# Patient Record
Sex: Male | Born: 1970 | Race: White | Hispanic: No | Marital: Married | Smoking: Never smoker
Health system: Southern US, Community
[De-identification: ages and names within clinical notes are randomized; demographics above are authoritative.]

## PROBLEM LIST (undated history)

## (undated) DIAGNOSIS — F329 Major depressive disorder, single episode, unspecified: Secondary | ICD-10-CM

## (undated) DIAGNOSIS — B019 Varicella without complication: Secondary | ICD-10-CM

## (undated) DIAGNOSIS — R319 Hematuria, unspecified: Secondary | ICD-10-CM

## (undated) DIAGNOSIS — N200 Calculus of kidney: Secondary | ICD-10-CM

## (undated) DIAGNOSIS — I10 Essential (primary) hypertension: Secondary | ICD-10-CM

## (undated) DIAGNOSIS — L719 Rosacea, unspecified: Secondary | ICD-10-CM

## (undated) DIAGNOSIS — F32A Depression, unspecified: Secondary | ICD-10-CM

## (undated) DIAGNOSIS — G4730 Sleep apnea, unspecified: Secondary | ICD-10-CM

## (undated) DIAGNOSIS — E785 Hyperlipidemia, unspecified: Secondary | ICD-10-CM

## (undated) DIAGNOSIS — T7840XA Allergy, unspecified, initial encounter: Secondary | ICD-10-CM

## (undated) DIAGNOSIS — T884XXA Failed or difficult intubation, initial encounter: Secondary | ICD-10-CM

## (undated) DIAGNOSIS — N189 Chronic kidney disease, unspecified: Secondary | ICD-10-CM

## (undated) DIAGNOSIS — Z87442 Personal history of urinary calculi: Secondary | ICD-10-CM

## (undated) HISTORY — DX: Varicella without complication: B01.9

## (undated) HISTORY — DX: Sleep apnea, unspecified: G47.30

## (undated) HISTORY — DX: Rosacea, unspecified: L71.9

## (undated) HISTORY — DX: Depression, unspecified: F32.A

## (undated) HISTORY — PX: NASAL SINUS SURGERY: SHX719

## (undated) HISTORY — DX: Allergy, unspecified, initial encounter: T78.40XA

## (undated) HISTORY — DX: Chronic kidney disease, unspecified: N18.9

## (undated) HISTORY — PX: LITHOTRIPSY: SUR834

## (undated) HISTORY — PX: BACK SURGERY: SHX140

## (undated) HISTORY — DX: Hematuria, unspecified: R31.9

## (undated) HISTORY — DX: Major depressive disorder, single episode, unspecified: F32.9

## (undated) HISTORY — DX: Hyperlipidemia, unspecified: E78.5

## (undated) HISTORY — PX: COLONOSCOPY: SHX174

## (undated) HISTORY — DX: Calculus of kidney: N20.0

## (undated) HISTORY — PX: WISDOM TOOTH EXTRACTION: SHX21

---

## 2006-03-04 ENCOUNTER — Ambulatory Visit: Payer: Self-pay | Admitting: Unknown Physician Specialty

## 2006-03-14 ENCOUNTER — Ambulatory Visit: Payer: Self-pay | Admitting: Internal Medicine

## 2006-03-19 ENCOUNTER — Ambulatory Visit: Payer: Self-pay | Admitting: Urology

## 2006-03-20 ENCOUNTER — Emergency Department: Payer: Self-pay | Admitting: Unknown Physician Specialty

## 2006-03-20 ENCOUNTER — Ambulatory Visit: Payer: Self-pay | Admitting: Urology

## 2006-04-01 ENCOUNTER — Ambulatory Visit: Payer: Self-pay | Admitting: Urology

## 2006-04-14 ENCOUNTER — Ambulatory Visit: Payer: Self-pay | Admitting: Urology

## 2007-12-18 ENCOUNTER — Ambulatory Visit: Payer: Self-pay | Admitting: Orthopedic Surgery

## 2011-07-16 ENCOUNTER — Ambulatory Visit: Payer: Self-pay | Admitting: Unknown Physician Specialty

## 2013-12-01 DIAGNOSIS — F329 Major depressive disorder, single episode, unspecified: Secondary | ICD-10-CM | POA: Insufficient documentation

## 2013-12-01 DIAGNOSIS — F32A Depression, unspecified: Secondary | ICD-10-CM | POA: Insufficient documentation

## 2013-12-01 DIAGNOSIS — G4730 Sleep apnea, unspecified: Secondary | ICD-10-CM | POA: Insufficient documentation

## 2014-06-01 DIAGNOSIS — E785 Hyperlipidemia, unspecified: Secondary | ICD-10-CM | POA: Insufficient documentation

## 2014-07-17 NOTE — Op Note (Signed)
PATIENT NAME:  Gregory Guzman, Gregory Guzman MR#:  [id number] DATE OF BIRTH:  March 02, 1971  DATE OF PROCEDURE:  07/16/2011  PREOPERATIVE DIAGNOSIS: Nasal obstruction.   POSTOPERATIVE DIAGNOSIS: Nasal obstruction.  OPERATION PERFORMED:  1. Nasal septoplasty.  2. Bilateral submucous resection inferior turbinates.   SURGEON: Davina Pokehapman T. Makinzi Prieur, MD   OPERATIVE FINDINGS: A large inferior left septal spur and bilateral inferior turbinate hypertrophy.   DESCRIPTION OF PROCEDURE: Gregory Guzman was identified in the holding area and taken to the operating room and placed in the supine position. After general endotracheal anesthesia, the table was turned 90 degrees. The patient was placed in semi-Fowler position. A topical anesthetic of phenylephrine lidocaine was placed on pledgets within each nostril. After approximately five minutes these were removed. Intranasal exam showed an inferior left septal spur and bilateral inferior turbinate hypertrophy. A local anesthetic of 1% lidocaine 1:100,000 epinephrine was used to inject the nasal septum as well as the inferior turbinates. A total of 11 mL was used. With the patient draped in the usual fashion for nasal surgery, a hemitransfixion incision was created in the leading edge of the septum on the right hand side. Subperichondrial plane was elevated posteriorly on the left. The inferior left septal spur was identified. Using the small chisel this was chiseled free and removed. This gave excellent reduction of the large septal spur. Going posteriorly in the dissection it appeared that he had had previous nasal surgery as the perpendicular plate of the ethmoid and the vomer was partly missing but appeared to be straight. With the nasal septum realigned after removing the spur, this allowed the septum to be centered in the midline. Reinspection on each side showed excellent reduction of the spur and the obstruction on the left.   Attention was then turned to the inferior turbinates.  Beginning on the left hand side, a 15 blade was used to incise along the inferior edge of the inferior turbinate. A superolaterally placed flap was then elevated. Knight scissors were then used to excise the underlying conchal bone and mucosa. The superolaterally based flap was laid back over the conchal stump and cauterized using suction cautery. In a similar fashion, submucous resection was performed on the right. With completion of this, examination showed excellent reduction of the septal deformity and turbinate hypertrophy. The hemitransfixion incision was closed using interrupted 3-0 chromic. Septal splints were placed within each nostril against the septum and affixed in position using a 3-0 nylon. Prior to this, a small fenestration was created in the posterior inferior aspect of the left flap to prevent hematoma. With the splints affixed in position, Surgiflo was then placed beneath each inferior turbinate for hemostasis. The patient was then returned to anesthesia where he was awakened in the operating room and taken to the recovery room in stable condition.   CULTURES: None.   SPECIMENS: None.   ESTIMATED BLOOD LOSS: Less than 20 mL.    ____________________________ Davina Pokehapman T. Toi Stelly, MD ctm:drc D: 07/16/2011 08:07:47 ET T: 07/16/2011 10:00:45 ET JOB#: [id number]  cc: Davina Pokehapman T. Shantera Monts, MD, <Dictator>  Davina PokeHAPMAN T Sahian Kerney MD ELECTRONICALLY SIGNED 08/01/2011 10:40

## 2015-01-27 ENCOUNTER — Other Ambulatory Visit: Payer: Self-pay | Admitting: Unknown Physician Specialty

## 2015-01-27 ENCOUNTER — Other Ambulatory Visit: Payer: Self-pay | Admitting: Family Medicine

## 2015-01-27 ENCOUNTER — Ambulatory Visit
Admission: RE | Admit: 2015-01-27 | Discharge: 2015-01-27 | Disposition: A | Discharge status: Home / Self Care | Payer: 59 | Source: Ambulatory Visit | Attending: Unknown Physician Specialty | Admitting: Unknown Physician Specialty

## 2015-01-27 DIAGNOSIS — R1032 Left lower quadrant pain: Secondary | ICD-10-CM

## 2015-01-27 DIAGNOSIS — N433 Hydrocele, unspecified: Secondary | ICD-10-CM | POA: Insufficient documentation

## 2015-01-27 DIAGNOSIS — R1030 Lower abdominal pain, unspecified: Secondary | ICD-10-CM | POA: Insufficient documentation

## 2015-02-06 ENCOUNTER — Encounter: Payer: Self-pay | Admitting: Obstetrics and Gynecology

## 2015-02-06 ENCOUNTER — Ambulatory Visit (INDEPENDENT_AMBULATORY_CARE_PROVIDER_SITE_OTHER): Payer: 59 | Admitting: Obstetrics and Gynecology

## 2015-02-06 ENCOUNTER — Encounter: Payer: Self-pay | Admitting: *Deleted

## 2015-02-06 VITALS — BP 169/119 | HR 97 | Resp 16 | Ht 72.0 in | Wt 170.5 lb

## 2015-02-06 DIAGNOSIS — N50819 Testicular pain, unspecified: Secondary | ICD-10-CM | POA: Diagnosis not present

## 2015-02-06 LAB — MICROSCOPIC EXAMINATION

## 2015-02-06 LAB — URINALYSIS, COMPLETE
Bilirubin, UA: NEGATIVE
Glucose, UA: NEGATIVE
KETONES UA: NEGATIVE
LEUKOCYTES UA: NEGATIVE
NITRITE UA: NEGATIVE
PH UA: 5.5 (ref 5.0–7.5)
RBC, UA: NEGATIVE
Specific Gravity, UA: >1.03 — ABNORMAL HIGH (ref 1.005–1.030)
Urobilinogen, Ur: 0.2 mg/dL (ref 0.2–1.0)

## 2015-02-06 NOTE — Progress Notes (Signed)
02/06/2015 10:35 AM   Gregory Guzman 08-03-1970 [id number]  Referring provider: [name], MD [id number] [facility] [address] [facility] [place], [place] [address]  Chief Complaint  Patient presents with  . Testicle Pain    Left inguinal  . Establish Care    HPI: Patient is a 44 year old male with a history of renal stones presenting today as a referral from his primary care provider for 2 episodes of severe left groin pain radiating to left testicle occurring over the last 2 months lasting 2-3 days in duration.  He was initially treated for urinary tract infection though urine cultures were negative. He does state that symptoms resolved after antibiotic therapy but then returned a few weeks later. Pain was exacerbated with LLE movement.  No alleviating factors. He returned to his primary care provider after second episode and underwent a scrotal ultrasound which was normal.  He is pain free today and denies any other symptoms.    Patient denies any urinary symptoms specifically no dysuria, frequency, gross hematuria.  Patient does have a long history of renal stones with first episode ocurring when he was 44 years old. Last episode 10 years ago for which he underwent ESWL. He has been able to pass all other stones with medical management.  No smoking history.  No family history of GU or prostate cancers.  PMH: Past Medical History  Diagnosis Date  . Chronic kidney disease   . Sleep apnea   . Allergy   . Kidney stone   . Chicken pox   . Depression   . HLD (hyperlipidemia)   . Rosacea   . Hematuria     Surgical History: Past Surgical History  Procedure Laterality Date  . Back surgery    . Nasal sinus surgery      x2    Home Medications:    Medication List       This list is accurate as of: 02/06/15 10:35 AM.  Always use your most recent med list.               citalopram 20 MG tablet  Commonly known as:  CELEXA     EPIPEN 2-PAK 0.3 mg/0.3 mL Soaj  injection  Generic drug:  EPINEPHrine  INJECT INTRAMUSCULARLY AS DIRECTED     HYDROcodone-acetaminophen 5-325 MG tablet  Commonly known as:  NORCO/VICODIN  TK 1 T PO Q 6 H PRN P     pravastatin 20 MG tablet  Commonly known as:  PRAVACHOL  TK 1 T PO NIGHTLY        Allergies: No Known Allergies  Family History: Family History  Problem Relation Age of Onset  . Kidney Stones Mother   . Kidney Stones Maternal Grandmother   . Heart disease Father   . Heart disease Paternal Grandmother   . Thyroid disease Sister     Social History:  reports that he has never smoked. He does not have any smokeless tobacco history on file. He reports that he drinks alcohol. He reports that he does not use illicit drugs.  ROS: UROLOGY Frequent Urination?: No Hard to postpone urination?: No Burning/pain with urination?: No Get up at night to urinate?: No Leakage of urine?: No Urine stream starts and stops?: No Trouble starting stream?: No Do you have to strain to urinate?: No Blood in urine?: Yes Urinary tract infection?: No Sexually transmitted disease?: No Injury to kidneys or bladder?: No Painful intercourse?: No Weak stream?: No Erection problems?: No Penile pain?:  No  Gastrointestinal Nausea?: No Vomiting?: No Indigestion/heartburn?: No Diarrhea?: No Constipation?: No  Constitutional Fever: No Night sweats?: No Weight loss?: No Fatigue?: No  Skin Skin rash/lesions?: No Itching?: No  Eyes Blurred vision?: No Double vision?: No  Ears/Nose/Throat Sore throat?: No Sinus problems?: No  Hematologic/Lymphatic Swollen glands?: No Easy bruising?: No  Cardiovascular Leg swelling?: No Chest pain?: No  Respiratory Cough?: No Shortness of breath?: No  Endocrine Excessive thirst?: No  Musculoskeletal Back pain?: No Joint pain?: No  Neurological Headaches?: No Dizziness?: No  Psychologic Depression?: No Anxiety?: No  Physical Exam: BP 169/119 mmHg   Pulse 97  Resp 16  Ht 6' (1.829 m)  Wt 170 lb 8 oz (77.338 kg)  BMI 23.12 kg/m2  Constitutional:  Alert and oriented, No acute distress. HEENT: [place] AT, moist mucus membranes.  Trachea midline, no masses. Cardiovascular: No clubbing, cyanosis, or edema. Respiratory: Normal respiratory effort, no increased work of breathing. GI: Abdomen is soft, nontender, nondistended, no abdominal masses GU: No CVA tenderness.  Testicles distended bilaterally without palpable masses or tenderness, small bilateral hydroceles present, normal uncircumcised phallus, no bleeding from urinary meatus Skin: No rashes, bruises or suspicious lesions. Lymph: No cervical or inguinal adenopathy. Neurologic: Grossly intact, no focal deficits, moving all 4 extremities. Psychiatric: Normal mood and affect.  Laboratory Data:   Urinalysis No results found for: COLORURINE, APPEARANCEUR, LABSPEC, PHURINE, GLUCOSEU, HGBUR, BILIRUBINUR, KETONESUR, PROTEINUR, UROBILINOGEN, NITRITE, LEUKOCYTESUR  Pertinent Imaging: CLINICAL DATA: Acute left inguinal pain.  EXAM: SCROTAL ULTRASOUND  DOPPLER ULTRASOUND OF THE TESTICLES  TECHNIQUE: Complete ultrasound examination of the testicles, epididymis, and other scrotal structures was performed. Color and spectral Doppler ultrasound were also utilized to evaluate blood flow to the testicles.  COMPARISON: None.  FINDINGS: Right testicle  Measurements: 4.0 x 2.5 x 2.1 cm. No mass or microlithiasis visualized.  Left testicle  Measurements: 3.7 x 2.7 x 2.1 cm. No mass or microlithiasis visualized.  Right epididymis: Normal in size and appearance.  Left epididymis: Normal in size and appearance.  Hydrocele: Mild bilateral hydroceles are noted.  Varicocele: None visualized.  Pulsed Doppler interrogation of both testes demonstrates normal low resistance arterial and venous waveforms bilaterally.  No definite abnormality seen within the inguinal  canals.  IMPRESSION: Mild bilateral hydroceles. No abnormality of testicular torsion or mass. Electronically Signed  By: Lupita RaiderJames Green Jr, M.D.  On: 01/27/2015 10:50           Assessment & Plan:    1. Groin/testicular pain left-  2 episodes of severe left groin pain radiating to left testicle occurring over the last 2 months lasting 2-3 days in duration.  Normal scrotal US. Urine culture negative. UA unremarkable today. CT renal stone study ordered for evaluation of possible renal stones. - Urinalysis, Complete  2. H/o Renal stones-Patient reports a long history of renal stones since he was 44 years old. He reports that approximately 4-5 stones episodes with only one requiring ESWL. All others were passed with medical management. -CT Renal Stone Study  Return for CT Results.  These notes generated with voice recognition software. I apologize for typographical errors.  Earlie LouLindsay Najae Filsaime, FNP  [facility] [address], [address] [place], [place] [address] [phone]

## 2015-02-06 NOTE — Patient Instructions (Signed)
Dietary Guidelines to Help Prevent Kidney Stones Your risk of kidney stones can be decreased by adjusting the foods you eat. The most important thing you can do is drink enough fluid. You should drink enough fluid to keep your urine clear or pale yellow. The following guidelines provide specific information for the type of kidney stone you have had. GUIDELINES ACCORDING TO TYPE OF KIDNEY STONE Calcium Oxalate Kidney Stones  Reduce the amount of salt you eat. Foods that have a lot of salt cause your body to release excess calcium into your urine. The excess calcium can combine with a substance called oxalate to form kidney stones.  Reduce the amount of animal protein you eat if the amount you eat is excessive. Animal protein causes your body to release excess calcium into your urine. Ask your dietitian how much protein from animal sources you should be eating.  Avoid foods that are high in oxalates. If you take vitamins, they should have less than 500 mg of vitamin C. Your body turns vitamin C into oxalates. You do not need to avoid fruits and vegetables high in vitamin C. Calcium Phosphate Kidney Stones  Reduce the amount of salt you eat to help prevent the release of excess calcium into your urine.  Reduce the amount of animal protein you eat if the amount you eat is excessive. Animal protein causes your body to release excess calcium into your urine. Ask your dietitian how much protein from animal sources you should be eating.  Get enough calcium from food or take a calcium supplement (ask your dietitian for recommendations). Food sources of calcium that do not increase your risk of kidney stones include:  Broccoli.  Dairy products, such as cheese and yogurt.  Pudding. Uric Acid Kidney Stones  Do not have more than 6 oz of animal protein per day. FOOD SOURCES Animal Protein Sources  Meat (all types).  Poultry.  Eggs.  Fish, seafood. Foods High in Salt  Salt seasonings.  Soy  sauce.  Teriyaki sauce.  Cured and processed meats.  Salted crackers and snack foods.  Fast food.  Canned soups and most canned foods. Foods High in Oxalates  Grains:  Amaranth.  Barley.  Grits.  Wheat germ.  Bran.  Buckwheat flour.  All bran cereals.  Pretzels.  Whole wheat bread.  Vegetables:  Beans (wax).  Beets and beet greens.  Collard greens.  Eggplant.  Escarole.  Leeks.  Okra.  Parsley.  Rutabagas.  Spinach.  Swiss chard.  Tomato paste.  Fried potatoes.  Sweet potatoes.  Fruits:  Red currants.  Figs.  Kiwi.  Rhubarb.  Meat and Other Protein Sources:  Beans (dried).  Soy burgers and other soybean products.  Miso.  Nuts (peanuts, almonds, pecans, cashews, hazelnuts).  Nut butters.  Sesame seeds and tahini (paste made of sesame seeds).  Poppy seeds.  Beverages:  Chocolate drink mixes.  Soy milk.  Instant iced tea.  Juices made from high-oxalate fruits or vegetables.  Other:  Carob.  Chocolate.  Fruitcake.  Marmalades.   This information is not intended to replace advice given to you by your health care provider. Make sure you discuss any questions you have with your health care provider.   Document Released: 07/06/2010 Document Revised: 03/16/2013 Document Reviewed: 02/05/2013 Elsevier Interactive Patient Education 2016 Elsevier Inc.  

## 2015-02-09 ENCOUNTER — Other Ambulatory Visit: Payer: Self-pay | Admitting: Obstetrics and Gynecology

## 2015-02-09 DIAGNOSIS — N200 Calculus of kidney: Secondary | ICD-10-CM

## 2015-02-09 DIAGNOSIS — N50819 Testicular pain, unspecified: Secondary | ICD-10-CM

## 2015-02-15 ENCOUNTER — Ambulatory Visit: Payer: 59 | Admitting: Obstetrics and Gynecology

## 2015-02-15 ENCOUNTER — Ambulatory Visit
Admission: RE | Admit: 2015-02-15 | Discharge: 2015-02-15 | Disposition: A | Discharge status: Home / Self Care | Payer: 59 | Source: Ambulatory Visit | Attending: Obstetrics and Gynecology | Admitting: Obstetrics and Gynecology

## 2015-02-15 DIAGNOSIS — N50819 Testicular pain, unspecified: Secondary | ICD-10-CM

## 2015-02-15 DIAGNOSIS — N200 Calculus of kidney: Secondary | ICD-10-CM | POA: Diagnosis present

## 2015-02-15 DIAGNOSIS — N201 Calculus of ureter: Secondary | ICD-10-CM | POA: Diagnosis not present

## 2015-02-20 ENCOUNTER — Telehealth: Payer: Self-pay | Admitting: Radiology

## 2015-02-20 ENCOUNTER — Encounter: Payer: Self-pay | Admitting: Obstetrics and Gynecology

## 2015-02-20 ENCOUNTER — Ambulatory Visit (INDEPENDENT_AMBULATORY_CARE_PROVIDER_SITE_OTHER): Payer: 59 | Admitting: Obstetrics and Gynecology

## 2015-02-20 VITALS — BP 128/91 | HR 73 | Ht 72.0 in | Wt 169.1 lb

## 2015-02-20 DIAGNOSIS — R3129 Other microscopic hematuria: Secondary | ICD-10-CM

## 2015-02-20 DIAGNOSIS — N200 Calculus of kidney: Secondary | ICD-10-CM | POA: Diagnosis not present

## 2015-02-20 LAB — URINALYSIS, COMPLETE
BILIRUBIN UA: NEGATIVE
GLUCOSE, UA: NEGATIVE
KETONES UA: NEGATIVE
Leukocytes, UA: NEGATIVE
NITRITE UA: NEGATIVE
Protein, UA: NEGATIVE
UUROB: 0.2 mg/dL (ref 0.2–1.0)
pH, UA: 5.5 (ref 5.0–7.5)

## 2015-02-20 LAB — MICROSCOPIC EXAMINATION

## 2015-02-20 NOTE — Telephone Encounter (Signed)
Pt notified of surgery scheduled 03/01/15, pre-admit testing appt on 02/23/15 @9 :00, to call day prior to surgery for arrival time to SDS and to be npo after mn the day of surgery. Pt voices understanding.

## 2015-02-20 NOTE — Progress Notes (Signed)
02/20/2015 8:43 AM   Gregory Guzman [birth year] [id number]  Referring provider: Rafael BihariJohn B Walker III, MD [phone] [facility] [address] [facility] [place], [place] [address]  Chief Complaint  Patient presents with  . Follow-up    CT results     HPI: Patient is a 44yo male with a history of renal stones and recent complaints of testicular pain and microscopic hematuria.  CT stone study demonstrating bilateral renal calculi with 7 mm stone at the right UPJ causing mild obstructive change. A 9 mm stone is noted within the right collecting system.  Patient presents today to discuss definitive management of his renal stones. He denies any cardiac or pulmonary issues. He does not take any blood thinning medication. He has no prior adverse reactions to general anesthesia.  PMH: Past Medical History  Diagnosis Date  . Chronic kidney disease   . Sleep apnea   . Allergy   . Kidney stone   . Chicken pox   . Depression   . HLD (hyperlipidemia)   . Rosacea   . Hematuria     Surgical History: Past Surgical History  Procedure Laterality Date  . Back surgery    . Nasal sinus surgery      x2    Home Medications:    Medication List       This list is accurate as of: 02/20/15  8:43 AM.  Always use your most recent med list.               citalopram 20 MG tablet  Commonly known as:  CELEXA     EPIPEN 2-PAK 0.3 mg/0.3 mL Soaj injection  Generic drug:  EPINEPHrine  INJECT INTRAMUSCULARLY AS DIRECTED     HYDROcodone-acetaminophen 5-325 MG tablet  Commonly known as:  NORCO/VICODIN  TK 1 T PO Q 6 H PRN P     pravastatin 20 MG tablet  Commonly known as:  PRAVACHOL  TK 1 T PO NIGHTLY        Allergies: No Known Allergies  Family History: Family History  Problem Relation Age of Onset  . Kidney Stones Mother   . Kidney Stones Maternal Grandmother   . Heart disease Father   . Heart disease Paternal Grandmother   . Thyroid disease Sister     Social History:  reports that  he has never smoked. He does not have any smokeless tobacco history on file. He reports that he drinks alcohol. He reports that he does not use illicit drugs.  ROS: UROLOGY Frequent Urination?: No Hard to postpone urination?: No Burning/pain with urination?: No Get up at night to urinate?: No Leakage of urine?: No Urine stream starts and stops?: No Trouble starting stream?: No Do you have to strain to urinate?: No Blood in urine?: Yes Urinary tract infection?: No Sexually transmitted disease?: No Injury to kidneys or bladder?: No Painful intercourse?: No Weak stream?: No Erection problems?: No Penile pain?: No  Gastrointestinal Nausea?: No Vomiting?: No Indigestion/heartburn?: No Diarrhea?: No Constipation?: No  Constitutional Fever: No Night sweats?: No Weight loss?: No Fatigue?: No  Skin Skin rash/lesions?: No Itching?: No  Eyes Blurred vision?: No Double vision?: No  Ears/Nose/Throat Sore throat?: No Sinus problems?: No  Hematologic/Lymphatic Swollen glands?: No Easy bruising?: No  Cardiovascular Leg swelling?: No Chest pain?: No  Respiratory Cough?: No Shortness of breath?: No  Endocrine Excessive thirst?: No  Musculoskeletal Back pain?: No Joint pain?: No  Neurological Headaches?: No Dizziness?: No  Psychologic Depression?: No Anxiety?: No  Physical Exam: BP 128/91 mmHg  Pulse 73  Ht 6' (1.829 m)  Wt 169 lb 1.6 oz (76.703 kg)  BMI 22.93 kg/m2  Constitutional:  Alert and oriented, No acute distress. HEENT: [place] AT, moist mucus membranes.  Trachea midline, no masses. Cardiovascular: No clubbing, cyanosis, or edema, RRR Respiratory: Normal respiratory effort, no increased work of breathing, CTAB GI: Abdomen is soft, nontender, nondistended, no abdominal masses GU: No CVA tenderness.  Skin: No rashes, bruises or suspicious lesions. Neurologic: Grossly intact, no focal deficits, moving all 4 extremities. Psychiatric: Normal mood and  affect.  Laboratory Data:   Urinalysis    Component Value Date/Time   GLUCOSEU Negative 02/06/2015 0924   BILIRUBINUR Negative 02/06/2015 0924   NITRITE Negative 02/06/2015 0924   LEUKOCYTESUR Negative 02/06/2015 0924    Pertinent Imaging: CLINICAL DATA: Bilateral testicular pain left greater than right, history of kidney stones  EXAM: CT ABDOMEN AND PELVIS WITHOUT CONTRAST  TECHNIQUE: Multidetector CT imaging of the abdomen and pelvis was performed following the standard protocol without IV contrast.  COMPARISON: 01/27/2015  FINDINGS: The lung bases are free of acute infiltrate or sizable effusion.  The liver, gallbladder, spleen, adrenal glands and pancreas are within normal limits.  The kidneys are well visualized bilaterally and reveal bilateral nonobstructing renal stones. The largest of these is noted in the upper pole of the right kidney measuring 9 mm in greatest dimension. The left collecting system and left ureter are within normal limits. There is mild prominence of the intrarenal collecting system on the right secondary to a stone at the ureteropelvic junction which measures approximately 7 mm in greatest dimension. The more distal right ureter is within normal limits.  The appendix is within normal limits. Mild aortoiliac calcifications are seen. No significant diverticular disease is noted. The bladder is well distended. No significant lymphadenopathy is noted. No acute bony abnormality is seen.  IMPRESSION: Bilateral renal calculi. A 7 mm stone is noted at the right UPJ causing mild obstructive change. A 9 mm stone is noted within the right collecting system.  No other focal abnormality is seen.  Assessment & Plan:   1. Kidney Stone-  Bilateral renal calculi. A 7 mm stone is noted at the right UPJ causing mild obstructive change. A 9 mm stone is noted within the right collecting system. We discussed various treatment options including  ESWL vs. ureteroscopy, laser lithotripsy, and stent. We discussed the risks and benefits of both including bleeding, infection, damage to surrounding structures, efficacy with need for possible further intervention, and need for temporary ureteral stent.  Patient understands risks and benefits of both procedures. He is opting to proceed with ureteroscopy for removal of right UPJ stone and non obstructing right renal stones if possible . Patient will be scheduled for surgery.  2. Microscopic hematuria- Patient opted for URS.  Will obtain cystoscopy and  left retrograde pyelogram at that time to complete microscopic hematuria workup. - Urinalysis, Complete - CULTURE, URINE COMPREHENSIVE  Pertinent labs & imaging results that were available during my care of the patient were reviewed by me and considered in my medical decision making (see chart for details).  No Follow-up on file.  These notes generated with voice recognition software. I apologize for typographical errors.  Secundino Ellithorpe, FNP  [facility] [address], [address] [place], [place] [address] [phone]   

## 2015-02-22 LAB — CULTURE, URINE COMPREHENSIVE

## 2015-02-23 ENCOUNTER — Telehealth: Payer: Self-pay

## 2015-02-23 ENCOUNTER — Encounter
Admission: RE | Admit: 2015-02-23 | Discharge: 2015-02-23 | Disposition: A | Discharge status: Home / Self Care | Payer: 59 | Source: Ambulatory Visit | Attending: Urology | Admitting: Urology

## 2015-02-23 DIAGNOSIS — N390 Urinary tract infection, site not specified: Secondary | ICD-10-CM

## 2015-02-23 DIAGNOSIS — Z01812 Encounter for preprocedural laboratory examination: Secondary | ICD-10-CM | POA: Diagnosis not present

## 2015-02-23 LAB — BASIC METABOLIC PANEL WITH GFR
Anion gap: 5 (ref 5–15)
BUN: 12 mg/dL (ref 6–20)
CO2: 28 mmol/L (ref 22–32)
Calcium: 8.8 mg/dL — ABNORMAL LOW (ref 8.9–10.3)
Chloride: 107 mmol/L (ref 101–111)
Creatinine, Ser: 1.11 mg/dL (ref 0.61–1.24)
GFR calc Af Amer: >60 mL/min
GFR calc non Af Amer: >60 mL/min
Glucose, Bld: 102 mg/dL — ABNORMAL HIGH (ref 65–99)
Potassium: 4.7 mmol/L (ref 3.5–5.1)
Sodium: 140 mmol/L (ref 135–145)

## 2015-02-23 LAB — CBC
HCT: 45.6 % (ref 40.0–52.0)
Hemoglobin: 15.3 g/dL (ref 13.0–18.0)
MCH: 30.4 pg (ref 26.0–34.0)
MCHC: 33.7 g/dL (ref 32.0–36.0)
MCV: 90.1 fL (ref 80.0–100.0)
PLATELETS: 248 10*3/uL (ref 150–440)
RBC: 5.06 MIL/uL (ref 4.40–5.90)
RDW: 12.6 % (ref 11.5–14.5)
WBC: 5.7 10*3/uL (ref 3.8–10.6)

## 2015-02-23 NOTE — Patient Instructions (Signed)
  Your procedure is scheduled on:03/01/15 Report to Day Surgery. To find out your arrival time please call [phone] between 1PM - 3PM on 02/28/15.  Remember: Instructions that are not followed completely may result in serious medical risk, up to and including death, or upon the discretion of your surgeon and anesthesiologist your surgery may need to be rescheduled.    __x__ 1. Do not eat food or drink liquids after midnight. No gum chewing or hard candies.     ___x_ 2. No Alcohol for 24 hours before or after surgery.   ____ 3. Bring all medications with you on the day of surgery if instructed.    __x__ 4. Notify your doctor if there is any change in your medical condition     (cold, fever, infections).     Do not wear jewelry, make-up, hairpins, clips or nail polish.  Do not wear lotions, powders, or perfumes. You may wear deodorant.  Do not shave 48 hours prior to surgery. Men may shave face and neck.  Do not bring valuables to the hospital.    [facility] for any belongings or valuables.               Contacts, dentures or bridgework may not be worn into surgery.  Leave your suitcase in the car. After surgery it may be brought to your room.  For patients admitted to the hospital, discharge time is determined by your                treatment team.   Patients discharged the day of surgery will not be allowed to drive home.   Please read over the following fact sheets that you were given:   Surgical Site Infection Prevention   __x__ Take these medicines the morning of surgery with A SIP OF WATER:    1. norco  2. celexa  3.   4.  5.  6.  ____ Fleet Enema (as directed)   ____ Use CHG Soap as directed  ____ Use inhalers on the day of surgery  ____ Stop metformin 2 days prior to surgery    ____ Take 1/2 of usual insulin dose the night before surgery and none on the morning of surgery.   ____ Stop Coumadin/Plavix/aspirin on   __x__ Stop  Anti-inflammatories on today   ____ Stop supplements until after surgery.    ____ Bring C-Pap to the hospital.

## 2015-02-23 NOTE — Telephone Encounter (Signed)
-----   Message from Fernanda DrumLindsay C Overton, FNP sent at 02/23/2015  1:40 PM EST ----- Please notify patient that his urine culture did grow out a small amount of bacteria. I believe it is likely that this is a contaminant that since he is having surgery on the seventh that would like him to return tomorrow and provide us with another CLEAN catch urine specimen to be sent for culture. Thanks

## 2015-02-23 NOTE — Telephone Encounter (Signed)
Spoke with pt in reference to ucx. Pt will RTC tomorrow morning at 8:00. Pt voiced understanding.

## 2015-02-24 ENCOUNTER — Other Ambulatory Visit: Payer: 59

## 2015-02-24 DIAGNOSIS — N390 Urinary tract infection, site not specified: Secondary | ICD-10-CM

## 2015-02-28 LAB — CULTURE, URINE COMPREHENSIVE

## 2015-03-01 ENCOUNTER — Ambulatory Visit
Admission: RE | Admit: 2015-03-01 | Discharge: 2015-03-01 | Disposition: A | Discharge status: Home / Self Care | Payer: 59 | Source: Ambulatory Visit | Attending: Urology | Admitting: Urology

## 2015-03-01 ENCOUNTER — Encounter: Payer: Self-pay | Admitting: *Deleted

## 2015-03-01 ENCOUNTER — Encounter
Admission: RE | Disposition: A | Discharge status: Home / Self Care | Payer: Self-pay | Source: Ambulatory Visit | Attending: Urology

## 2015-03-01 ENCOUNTER — Ambulatory Visit: Payer: 59 | Admitting: Anesthesiology

## 2015-03-01 DIAGNOSIS — E785 Hyperlipidemia, unspecified: Secondary | ICD-10-CM | POA: Insufficient documentation

## 2015-03-01 DIAGNOSIS — N189 Chronic kidney disease, unspecified: Secondary | ICD-10-CM | POA: Diagnosis not present

## 2015-03-01 DIAGNOSIS — F329 Major depressive disorder, single episode, unspecified: Secondary | ICD-10-CM | POA: Insufficient documentation

## 2015-03-01 DIAGNOSIS — Z8349 Family history of other endocrine, nutritional and metabolic diseases: Secondary | ICD-10-CM | POA: Diagnosis not present

## 2015-03-01 DIAGNOSIS — R319 Hematuria, unspecified: Secondary | ICD-10-CM | POA: Diagnosis present

## 2015-03-01 DIAGNOSIS — N200 Calculus of kidney: Secondary | ICD-10-CM | POA: Diagnosis not present

## 2015-03-01 DIAGNOSIS — R3129 Other microscopic hematuria: Secondary | ICD-10-CM | POA: Diagnosis not present

## 2015-03-01 DIAGNOSIS — N135 Crossing vessel and stricture of ureter without hydronephrosis: Secondary | ICD-10-CM | POA: Insufficient documentation

## 2015-03-01 DIAGNOSIS — N5082 Scrotal pain: Secondary | ICD-10-CM | POA: Diagnosis not present

## 2015-03-01 DIAGNOSIS — N359 Urethral stricture, unspecified: Secondary | ICD-10-CM | POA: Diagnosis not present

## 2015-03-01 DIAGNOSIS — Z8249 Family history of ischemic heart disease and other diseases of the circulatory system: Secondary | ICD-10-CM | POA: Insufficient documentation

## 2015-03-01 DIAGNOSIS — Z9889 Other specified postprocedural states: Secondary | ICD-10-CM | POA: Insufficient documentation

## 2015-03-01 DIAGNOSIS — G4730 Sleep apnea, unspecified: Secondary | ICD-10-CM | POA: Insufficient documentation

## 2015-03-01 DIAGNOSIS — Z841 Family history of disorders of kidney and ureter: Secondary | ICD-10-CM | POA: Diagnosis not present

## 2015-03-01 DIAGNOSIS — Z87442 Personal history of urinary calculi: Secondary | ICD-10-CM | POA: Diagnosis not present

## 2015-03-01 DIAGNOSIS — R311 Benign essential microscopic hematuria: Secondary | ICD-10-CM | POA: Diagnosis not present

## 2015-03-01 HISTORY — PX: CYSTOSCOPY/URETEROSCOPY/HOLMIUM LASER/STENT PLACEMENT: SHX6546

## 2015-03-01 HISTORY — PX: CYSTOSCOPY W/ RETROGRADES: SHX1426

## 2015-03-01 SURGERY — CYSTOSCOPY/URETEROSCOPY/HOLMIUM LASER/STENT PLACEMENT
Anesthesia: General | Laterality: Right | Wound class: Clean Contaminated

## 2015-03-01 MED ORDER — FENTANYL CITRATE (PF) 100 MCG/2ML IJ SOLN
25.0000 ug | INTRAMUSCULAR | Status: DC | PRN
  Administered 2015-03-01 (×4): 25 ug via INTRAVENOUS

## 2015-03-01 MED ORDER — ONDANSETRON HCL 4 MG/2ML IJ SOLN
4.0000 mg | Freq: Once | INTRAMUSCULAR | Status: AC
  Administered 2015-03-01: 4 mg via INTRAVENOUS

## 2015-03-01 MED ORDER — BELLADONNA ALKALOIDS-OPIUM 16.2-60 MG RE SUPP
RECTAL | Status: AC
  Administered 2015-03-01: 1 via RECTAL
  Filled 2015-03-01: qty 1

## 2015-03-01 MED ORDER — BELLADONNA ALKALOIDS-OPIUM 16.2-60 MG RE SUPP
1.0000 | Freq: Three times a day (TID) | RECTAL | Status: DC | PRN
  Administered 2015-03-01: 1 via RECTAL

## 2015-03-01 MED ORDER — PHENAZOPYRIDINE HCL 200 MG PO TABS: 200.0000 mg | ORAL_TABLET | Freq: Three times a day (TID) | ORAL | Status: DC | PRN

## 2015-03-01 MED ORDER — PROPOFOL 10 MG/ML IV BOLUS
INTRAVENOUS | Status: DC | PRN
  Administered 2015-03-01: 150 mg via INTRAVENOUS

## 2015-03-01 MED ORDER — LACTATED RINGERS IV SOLN
INTRAVENOUS | Status: DC
Start: 2015-03-01 — End: 2015-03-01
  Administered 2015-03-01 (×2): via INTRAVENOUS

## 2015-03-01 MED ORDER — FAMOTIDINE 20 MG PO TABS
ORAL_TABLET | ORAL | Status: AC
  Filled 2015-03-01: qty 1

## 2015-03-01 MED ORDER — PROMETHAZINE HCL 25 MG/ML IJ SOLN
6.2500 mg | INTRAMUSCULAR | Status: DC | PRN
  Administered 2015-03-01: 6.25 mg via INTRAVENOUS

## 2015-03-01 MED ORDER — FENTANYL CITRATE (PF) 100 MCG/2ML IJ SOLN
INTRAMUSCULAR | Status: DC | PRN
  Administered 2015-03-01 (×2): 50 ug via INTRAVENOUS

## 2015-03-01 MED ORDER — TAMSULOSIN HCL 0.4 MG PO CAPS: 0.4000 mg | ORAL_CAPSULE | Freq: Every day | ORAL | Status: DC

## 2015-03-01 MED ORDER — HYDROMORPHONE HCL 1 MG/ML IJ SOLN
0.2500 mg | INTRAMUSCULAR | Status: DC | PRN
  Administered 2015-03-01 (×3): 0.5 mg via INTRAVENOUS

## 2015-03-01 MED ORDER — OXYBUTYNIN CHLORIDE 5 MG PO TABS: 5.0000 mg | ORAL_TABLET | Freq: Three times a day (TID) | ORAL | Status: DC | PRN

## 2015-03-01 MED ORDER — CEFAZOLIN SODIUM-DEXTROSE 2-3 GM-% IV SOLR
INTRAVENOUS | Status: AC
  Filled 2015-03-01: qty 50

## 2015-03-01 MED ORDER — DEXAMETHASONE SODIUM PHOSPHATE 4 MG/ML IJ SOLN
INTRAMUSCULAR | Status: DC | PRN
  Administered 2015-03-01: 4 mg via INTRAVENOUS

## 2015-03-01 MED ORDER — MIDAZOLAM HCL 2 MG/2ML IJ SOLN
INTRAMUSCULAR | Status: DC | PRN
  Administered 2015-03-01: 2 mg via INTRAVENOUS

## 2015-03-01 MED ORDER — GLYCOPYRROLATE 0.2 MG/ML IJ SOLN
INTRAMUSCULAR | Status: DC | PRN
  Administered 2015-03-01: .8 mg via INTRAVENOUS

## 2015-03-01 MED ORDER — PROMETHAZINE HCL 25 MG/ML IJ SOLN
INTRAMUSCULAR | Status: AC
  Administered 2015-03-01: 6.25 mg via INTRAVENOUS
  Filled 2015-03-01: qty 1

## 2015-03-01 MED ORDER — NEOSTIGMINE METHYLSULFATE 10 MG/10ML IV SOLN
INTRAVENOUS | Status: DC | PRN
  Administered 2015-03-01: 5 mg via INTRAVENOUS

## 2015-03-01 MED ORDER — OXYCODONE-ACETAMINOPHEN 5-325 MG PO TABS
1.0000 | ORAL_TABLET | ORAL | Status: DC | PRN
  Administered 2015-03-01: 1 via ORAL

## 2015-03-01 MED ORDER — IOTHALAMATE MEGLUMINE 43 % IV SOLN
INTRAVENOUS | Status: DC | PRN
  Administered 2015-03-01: 18 mL via SURGICAL_CAVITY

## 2015-03-01 MED ORDER — MEPERIDINE HCL 25 MG/ML IJ SOLN
INTRAMUSCULAR | Status: AC
  Administered 2015-03-01: 6.25 mg via INTRAVENOUS
  Filled 2015-03-01: qty 1

## 2015-03-01 MED ORDER — MEPERIDINE HCL 25 MG/ML IJ SOLN
6.2500 mg | INTRAMUSCULAR | Status: DC | PRN
  Administered 2015-03-01: 6.25 mg via INTRAVENOUS

## 2015-03-01 MED ORDER — OXYCODONE-ACETAMINOPHEN 5-325 MG PO TABS: 1.0000 | ORAL_TABLET | ORAL | Status: DC | PRN

## 2015-03-01 MED ORDER — CEFAZOLIN SODIUM-DEXTROSE 2-3 GM-% IV SOLR
2.0000 g | Freq: Once | INTRAVENOUS | Status: AC
  Administered 2015-03-01: 2 g via INTRAVENOUS

## 2015-03-01 MED ORDER — SODIUM CHLORIDE 0.9 % IJ SOLN
INTRAMUSCULAR | Status: AC
  Filled 2015-03-01: qty 10

## 2015-03-01 MED ORDER — LIDOCAINE HCL (CARDIAC) 20 MG/ML IV SOLN
INTRAVENOUS | Status: DC | PRN
  Administered 2015-03-01: 80 mg via INTRAVENOUS

## 2015-03-01 MED ORDER — FENTANYL CITRATE (PF) 100 MCG/2ML IJ SOLN
INTRAMUSCULAR | Status: AC
  Administered 2015-03-01: 25 ug via INTRAVENOUS
  Filled 2015-03-01: qty 2

## 2015-03-01 MED ORDER — FAMOTIDINE 20 MG PO TABS: 20.0000 mg | ORAL_TABLET | Freq: Once | ORAL | Status: DC

## 2015-03-01 MED ORDER — ONDANSETRON HCL 4 MG/2ML IJ SOLN
INTRAMUSCULAR | Status: DC | PRN
  Administered 2015-03-01: 4 mg via INTRAVENOUS

## 2015-03-01 MED ORDER — OXYCODONE-ACETAMINOPHEN 5-325 MG PO TABS
ORAL_TABLET | ORAL | Status: AC
  Administered 2015-03-01: 1 via ORAL
  Filled 2015-03-01: qty 1

## 2015-03-01 MED ORDER — HYDROMORPHONE HCL 1 MG/ML IJ SOLN
INTRAMUSCULAR | Status: AC
  Administered 2015-03-01: 0.5 mg via INTRAVENOUS
  Filled 2015-03-01: qty 1

## 2015-03-01 MED ORDER — SUCCINYLCHOLINE CHLORIDE 20 MG/ML IJ SOLN
INTRAMUSCULAR | Status: DC | PRN
  Administered 2015-03-01: 80 mg via INTRAVENOUS

## 2015-03-01 MED ORDER — ROCURONIUM BROMIDE 100 MG/10ML IV SOLN
INTRAVENOUS | Status: DC | PRN
  Administered 2015-03-01: 10 mg via INTRAVENOUS
  Administered 2015-03-01: 20 mg via INTRAVENOUS

## 2015-03-01 MED ORDER — HYDROMORPHONE HCL 1 MG/ML IJ SOLN
INTRAMUSCULAR | Status: AC
  Filled 2015-03-01: qty 1

## 2015-03-01 MED ORDER — ONDANSETRON HCL 4 MG/2ML IJ SOLN
INTRAMUSCULAR | Status: AC
  Filled 2015-03-01: qty 2

## 2015-03-01 SURGICAL SUPPLY — 29 items
ADAPTER SCOPE UROLOK II (MISCELLANEOUS) ×4 IMPLANT
BAG DRAIN CYSTO-URO LG1000N (MISCELLANEOUS) ×4 IMPLANT
BASKET ZERO TIP 1.9FR (BASKET) IMPLANT
CATH URETL 5X70 OPEN END (CATHETERS) ×4 IMPLANT
CNTNR SPEC 2.5X3XGRAD LEK (MISCELLANEOUS) ×2
CONRAY 43 FOR UROLOGY 50M (MISCELLANEOUS) ×4 IMPLANT
CONT SPEC 4OZ STER OR WHT (MISCELLANEOUS) ×2
CONTAINER SPEC 2.5X3XGRAD LEK (MISCELLANEOUS) ×2 IMPLANT
GLOVE BIO SURGEON STRL SZ 6.5 (GLOVE) ×3 IMPLANT
GLOVE BIO SURGEON STRL SZ7 (GLOVE) ×8 IMPLANT
GLOVE BIO SURGEONS STRL SZ 6.5 (GLOVE) ×1
GOWN STRL REUS W/ TWL LRG LVL3 (GOWN DISPOSABLE) ×4 IMPLANT
GOWN STRL REUS W/TWL LRG LVL3 (GOWN DISPOSABLE) ×4
GUIDEWIRE GREEN .038 145CM (MISCELLANEOUS) ×4 IMPLANT
INTRODUCER DILATOR DOUBLE (INTRODUCER) ×4 IMPLANT
KIT RM TURNOVER CYSTO AR (KITS) ×4 IMPLANT
PACK CYSTO AR (MISCELLANEOUS) ×4 IMPLANT
PREP PVP WINGED SPONGE (MISCELLANEOUS) ×4 IMPLANT
PUMP SINGLE ACTION SAP (PUMP) ×4 IMPLANT
SENSORWIRE 0.038 NOT ANGLED (WIRE) ×8
SET CYSTO W/LG BORE CLAMP LF (SET/KITS/TRAYS/PACK) ×4 IMPLANT
SHEATH URETERAL 12FRX35CM (MISCELLANEOUS) ×4 IMPLANT
SOL .9 NS 3000ML IRR  AL (IV SOLUTION) ×2
SOL .9 NS 3000ML IRR UROMATIC (IV SOLUTION) ×2 IMPLANT
STENT URET 6FRX24 CONTOUR (STENTS) IMPLANT
STENT URET 6FRX26 CONTOUR (STENTS) ×4 IMPLANT
SURGILUBE 2OZ TUBE FLIPTOP (MISCELLANEOUS) ×4 IMPLANT
WATER STERILE IRR 1000ML POUR (IV SOLUTION) ×4 IMPLANT
WIRE SENSOR 0.038 NOT ANGLED (WIRE) ×4 IMPLANT

## 2015-03-01 NOTE — Progress Notes (Signed)
Throat sore  Slight swollen in throat dr Karlton Lemonkarenz in to check pt

## 2015-03-01 NOTE — H&P (View-Only) (Signed)
02/20/2015 8:43 AM   Tonny Bollmanussell B Bollier [birth year] [id number]  Referring provider: Rafael BihariJohn B Walker III, MD [phone] [facility] [address] [address] [facility] [facility] [address], [place] [address]  Chief Complaint  Patient presents with  . Follow-up    CT results     HPI: Patient is a 44yo male with a history of renal stones and recent complaints of testicular pain and microscopic hematuria.  CT stone study demonstrating bilateral renal calculi with 7 mm stone at the right UPJ causing mild obstructive change. A 9 mm stone is noted within the right collecting system.  Patient presents today to discuss definitive management of his renal stones. He denies any cardiac or pulmonary issues. He does not take any blood thinning medication. He has no prior adverse reactions to general anesthesia.  PMH: Past Medical History  Diagnosis Date  . Chronic kidney disease   . Sleep apnea   . Allergy   . Kidney stone   . Chicken pox   . Depression   . HLD (hyperlipidemia)   . Rosacea   . Hematuria     Surgical History: Past Surgical History  Procedure Laterality Date  . Back surgery    . Nasal sinus surgery      x2    Home Medications:    Medication List       This list is accurate as of: 02/20/15  8:43 AM.  Always use your most recent med list.               citalopram 20 MG tablet  Commonly known as:  CELEXA     EPIPEN 2-PAK 0.3 mg/0.3 mL Soaj injection  Generic drug:  EPINEPHrine  INJECT INTRAMUSCULARLY AS DIRECTED     HYDROcodone-acetaminophen 5-325 MG tablet  Commonly known as:  NORCO/VICODIN  TK 1 T PO Q 6 H PRN P     pravastatin 20 MG tablet  Commonly known as:  PRAVACHOL  TK 1 T PO NIGHTLY        Allergies: No Known Allergies  Family History: Family History  Problem Relation Age of Onset  . Kidney Stones Mother   . Kidney Stones Maternal Grandmother   . Heart disease Father   . Heart disease Paternal Grandmother   . Thyroid disease Sister     Social History:  reports that  he has never smoked. He does not have any smokeless tobacco history on file. He reports that he drinks alcohol. He reports that he does not use illicit drugs.  ROS: UROLOGY Frequent Urination?: No Hard to postpone urination?: No Burning/pain with urination?: No Get up at night to urinate?: No Leakage of urine?: No Urine stream starts and stops?: No Trouble starting stream?: No Do you have to strain to urinate?: No Blood in urine?: Yes Urinary tract infection?: No Sexually transmitted disease?: No Injury to kidneys or bladder?: No Painful intercourse?: No Weak stream?: No Erection problems?: No Penile pain?: No  Gastrointestinal Nausea?: No Vomiting?: No Indigestion/heartburn?: No Diarrhea?: No Constipation?: No  Constitutional Fever: No Night sweats?: No Weight loss?: No Fatigue?: No  Skin Skin rash/lesions?: No Itching?: No  Eyes Blurred vision?: No Double vision?: No  Ears/Nose/Throat Sore throat?: No Sinus problems?: No  Hematologic/Lymphatic Swollen glands?: No Easy bruising?: No  Cardiovascular Leg swelling?: No Chest pain?: No  Respiratory Cough?: No Shortness of breath?: No  Endocrine Excessive thirst?: No  Musculoskeletal Back pain?: No Joint pain?: No  Neurological Headaches?: No Dizziness?: No  Psychologic Depression?: No Anxiety?: No  Physical Exam: BP 128/91 mmHg  Pulse 73  Ht 6' (1.829 m)  Wt 169 lb 1.6 oz (76.703 kg)  BMI 22.93 kg/m2  Constitutional:  Alert and oriented, No acute distress. HEENT: [place] AT, moist mucus membranes.  Trachea midline, no masses. Cardiovascular: No clubbing, cyanosis, or edema, RRR Respiratory: Normal respiratory effort, no increased work of breathing, CTAB GI: Abdomen is soft, nontender, nondistended, no abdominal masses GU: No CVA tenderness.  Skin: No rashes, bruises or suspicious lesions. Neurologic: Grossly intact, no focal deficits, moving all 4 extremities. Psychiatric: Normal mood and  affect.  Laboratory Data:   Urinalysis    Component Value Date/Time   GLUCOSEU Negative 02/06/2015 0924   BILIRUBINUR Negative 02/06/2015 0924   NITRITE Negative 02/06/2015 0924   LEUKOCYTESUR Negative 02/06/2015 0924    Pertinent Imaging: CLINICAL DATA: Bilateral testicular pain left greater than right, history of kidney stones  EXAM: CT ABDOMEN AND PELVIS WITHOUT CONTRAST  TECHNIQUE: Multidetector CT imaging of the abdomen and pelvis was performed following the standard protocol without IV contrast.  COMPARISON: 01/27/2015  FINDINGS: The lung bases are free of acute infiltrate or sizable effusion.  The liver, gallbladder, spleen, adrenal glands and pancreas are within normal limits.  The kidneys are well visualized bilaterally and reveal bilateral nonobstructing renal stones. The largest of these is noted in the upper pole of the right kidney measuring 9 mm in greatest dimension. The left collecting system and left ureter are within normal limits. There is mild prominence of the intrarenal collecting system on the right secondary to a stone at the ureteropelvic junction which measures approximately 7 mm in greatest dimension. The more distal right ureter is within normal limits.  The appendix is within normal limits. Mild aortoiliac calcifications are seen. No significant diverticular disease is noted. The bladder is well distended. No significant lymphadenopathy is noted. No acute bony abnormality is seen.  IMPRESSION: Bilateral renal calculi. A 7 mm stone is noted at the right UPJ causing mild obstructive change. A 9 mm stone is noted within the right collecting system.  No other focal abnormality is seen.  Assessment & Plan:   1. Kidney Stone-  Bilateral renal calculi. A 7 mm stone is noted at the right UPJ causing mild obstructive change. A 9 mm stone is noted within the right collecting system. We discussed various treatment options including  ESWL vs. ureteroscopy, laser lithotripsy, and stent. We discussed the risks and benefits of both including bleeding, infection, damage to surrounding structures, efficacy with need for possible further intervention, and need for temporary ureteral stent.  Patient understands risks and benefits of both procedures. He is opting to proceed with ureteroscopy for removal of right UPJ stone and non obstructing right renal stones if possible . Patient will be scheduled for surgery.  2. Microscopic hematuria- Patient opted for URS.  Will obtain cystoscopy and  left retrograde pyelogram at that time to complete microscopic hematuria workup. - Urinalysis, Complete - CULTURE, URINE COMPREHENSIVE  Pertinent labs & imaging results that were available during my care of the patient were reviewed by me and considered in my medical decision making (see chart for details).  No Follow-up on file.  These notes generated with voice recognition software. I apologize for typographical errors.  Earlie Lou, FNP  [facility] [address] [place], [place] [address] [phone]

## 2015-03-01 NOTE — Anesthesia Procedure Notes (Signed)
Procedure Name: Intubation Date/Time: 03/01/2015 12:09 PM Performed by: Irving BurtonBACHICH, Gregory Guzman Pre-anesthesia Checklist: Patient identified, Emergency Drugs available, Suction available and Patient being monitored Patient Re-evaluated:Patient Re-evaluated prior to inductionOxygen Delivery Method: Circle system utilized Preoxygenation: Pre-oxygenation with 100% oxygen Intubation Type: IV induction Ventilation: Mask ventilation without difficulty Laryngoscope Size: Mac and 4 Grade View: Grade III Tube type: Oral Tube size: 7.5 mm Number of attempts: 1 Airway Equipment and Method: Patient positioned with wedge pillow and Stylet Placement Confirmation: positive ETCO2 and breath sounds checked- equal and bilateral Secured at: 21 cm Tube secured with: Tape Dental Injury: Teeth and Oropharynx as per pre-operative assessment  Difficulty Due To: Difficult Airway- due to anterior larynx

## 2015-03-01 NOTE — Progress Notes (Signed)
b  And o supp given    Nasal airway out   Not shaking as  Much   Still having a lot of pain

## 2015-03-01 NOTE — Anesthesia Preprocedure Evaluation (Signed)
Anesthesia Evaluation  Patient identified by MRN, date of birth, ID band Patient awake    Reviewed: Allergy & Precautions, H&P , NPO status , Patient's Chart, lab work & pertinent test results, reviewed documented beta blocker date and time   History of Anesthesia Complications Negative for: history of anesthetic complications  Airway Mallampati: II  TM Distance: <3 FB Neck ROM: full    Dental no notable dental hx. (+) Teeth Intact   Pulmonary sleep apnea and Continuous Positive Airway Pressure Ventilation ,    Pulmonary exam normal breath sounds clear to auscultation       Cardiovascular Exercise Tolerance: Good negative cardio ROS Normal cardiovascular exam Rhythm:regular Rate:Normal     Neuro/Psych PSYCHIATRIC DISORDERS (depression) negative neurological ROS     GI/Hepatic negative GI ROS, Neg liver ROS,   Endo/Other  negative endocrine ROS  Renal/GU CRFRenal disease (kidney stones)  negative genitourinary   Musculoskeletal   Abdominal   Peds  Hematology negative hematology ROS (+)   Anesthesia Other Findings Past Medical History:   Chronic kidney disease                                       Sleep apnea                                                  Allergy                                                      Kidney stone                                                 Chicken pox                                                  Depression                                                   HLD (hyperlipidemia)                                         Rosacea                                                      Hematuria  Reproductive/Obstetrics negative OB ROS                             Anesthesia Physical Anesthesia Plan  ASA: II  Anesthesia Plan: General   Post-op Pain Management:    Induction:   Airway Management  Planned:   Additional Equipment:   Intra-op Plan:   Post-operative Plan:   Informed Consent: I have reviewed the patients History and Physical, chart, labs and discussed the procedure including the risks, benefits and alternatives for the proposed anesthesia with the patient or authorized representative who has indicated his/her understanding and acceptance.   Dental Advisory Given  Plan Discussed with: Anesthesiologist, CRNA and Surgeon  Anesthesia Plan Comments:         Anesthesia Quick Evaluation

## 2015-03-01 NOTE — Op Note (Signed)
Date of procedure: 03/01/2015  Preoperative diagnosis:  1. Right kidney stone 2. Hematuria   Postoperative diagnosis:  1. Right kidney stone  2. Hematuria 3. Right ureteral stenosis  Procedure: 1. Cystoscopy 2. Bilateral retrograde pyelogram 3. Right ureteroscopy 4. Right ureteral stent placement  Surgeon: Hollice Espy, MD  Anesthesia: General  Complications: None  Intraoperative findings: Bilateral retrogrades negative, delicate ureters without filling defects or hydronephrosis.  Right ureteral narrowing at the level of the iliacs as well as proximal/mid ureter. Unable to advance scope to the level of kidney.  EBL: Minimal  Specimens: None  Drains: 6 x 26 French double-J ureteral stent on right  Indication: Gregory Guzman is a 44 y.o. patient with microscopic hematuria and nephrolithiasis. His a fairly significant right sided kidney stone burden.  After reviewing the management options for treatment, he elected to proceed with the above surgical procedure(s). We have discussed the potential benefits and risks of the procedure, side effects of the proposed treatment, the likelihood of the patient achieving the goals of the procedure, and any potential problems that might occur during the procedure or recuperation. Informed consent has been obtained.  Description of procedure:  The patient was taken to the operating room and general anesthesia was induced.  The patient was placed in the dorsal lithotomy position, prepped and draped in the usual sterile fashion, and preoperative antibiotics were administered. A preoperative time-out was performed.   A rigid 21 French cystoscope was advanced per urethra into the bladder. The bladder was carefully inspected and noted to be free of any lesions or tumors. Attention was then turned to the left ureter orifice which was cannulated easily using a 5 [place] open-ended ureteral catheter just within the UO. Contrast was gently injected a  retrograde pyelogram was performed. This revealed a delicate ureter and collecting system without any filling defects or hydronephrosis. The same exact procedure was performed on the right side as well. This also revealed no filling defects or hydronephrosis. Prior to injecting contrast, shadows from the stones could be seen on fluoroscopy.  A wire was then placed up to level of the right kidney without difficulty. A second wire was introduced using the same technique. I then attempted to advance an access sheath over the working wire, but met resistance just beyond the UO therefore this was abandoned in order to prevent any ureteral trauma. I then attempted to pass the scope over the working wire and was able to pass it up to the level of the iliacs, however, met resistance here. I then exchanged the sensor wire for a superstiff and attempted to advance the scope over the Super Stiff. This did advance beyond the iliacs although the it was fairly tight at this location. The scope was able to be passed up to the proximal/mid ureter where again another area of narrowing was encountered. Under direct visualization, this appeared to be a somewhat scarlike area of concentric narrowing without any tumors or stones. In order to avoid any trauma to the ureter, I elected to proceed with staged ureteroscopy and place a stent at this time only. The scope was removed and the safety wire was backloaded over a rigid cystoscope. A 6 x 26 French double-J ureteral stent was advanced over the wire up to level of the renal pelvis. The wire was partially withdrawn and the distal end of the stent wrapped around an upper pole calyx. The wire was then fully withdrawn and a coil was noted within the bladder. The bladder  was drained. The scope was removed. The patient was then repositioned the supine position, reversed from anesthesia, and taken the PACU in stable condition. Next  Plan: Patient will be rebooked for a second staged  procedure in one to 2 weeks after allowing for passive dilation of the right ureter.  Hollice Espy, M.D.

## 2015-03-01 NOTE — Discharge Instructions (Signed)
You have a ureteral stent in place.  This is a tube that extends from your kidney to your bladder.  This may cause urinary bleeding, burning with urination, and urinary frequency.  Please call our office or present to the ED if you develop fevers >101 or pain which is not able to be controlled with oral pain medications.  You may be given either Flomax and/ or ditropan to help with bladder spasms and stent pain in addition to pain medications.   ° °[facility] °[address], [address] °[place], [place] [address] °[phone] ° ° ° °AMBULATORY SURGERY  °DISCHARGE INSTRUCTIONS ° ° °1) The drugs that you were given will stay in your system until tomorrow so for the next 24 hours you should not: ° °A) Drive an automobile °B) Make any legal decisions °C) Drink any alcoholic beverage ° ° °2) You may resume regular meals tomorrow.  Today it is better to start with liquids and gradually work up to solid foods. ° °You may eat anything you prefer, but it is better to start with liquids, then soup and crackers, and gradually work up to solid foods. ° ° °3) Please notify your doctor immediately if you have any unusual bleeding, trouble breathing, redness and pain at the surgery site, drainage, fever, or pain not relieved by medication. ° ° ° °4) Additional Instructions: ° ° ° ° ° ° ° °Please contact your physician with any problems or Same Day Surgery at [phone], Monday through Friday 6 am to 4 pm, or [place] at  Main number at [phone]. °

## 2015-03-01 NOTE — Progress Notes (Signed)
Blood pressure 134/102  Dr Karlton Lemonkarenz  Fine with this

## 2015-03-01 NOTE — Progress Notes (Signed)
Shaking all over demerol 6.25 given dr Ballard Russellkarnez in to see pt  Pupils dilated

## 2015-03-01 NOTE — Progress Notes (Signed)
Oral airway pushing tongue back  Removed and replaced by nasal trumpet by crna   Turned oxygen up to 10 simple mask

## 2015-03-01 NOTE — Transfer of Care (Signed)
Immediate Anesthesia Transfer of Care Note  Patient: Gregory Guzman  Procedure(s) Performed: Procedure(s): CYSTOSCOPY/URETEROSCOPY (Right) CYSTOSCOPY WITH RETROGRADE PYELOGRAM (Left)  Patient Location: PACU  Anesthesia Type:General  Level of Consciousness: sedated  Airway & Oxygen Therapy: Patient Spontanous Breathing and Patient connected to face mask oxygen  Post-op Assessment: Report given to RN and Post -op Vital signs reviewed and stable  Post vital signs: stable  Last Vitals:  Filed Vitals:   03/01/15 1110  BP: 138/104  Pulse: 74  Temp: 36.7 C  Resp: 16    Complications: No apparent anesthesia complications

## 2015-03-01 NOTE — Interval H&P Note (Signed)
History and Physical Interval Note:  03/01/2015 11:48 AM  Tonny Bollmanussell B Waide  has presented today for surgery, with the diagnosis of KIDNEY STONES  The various methods of treatment have been discussed with the patient and family. After consideration of risks, benefits and other options for treatment, the patient has consented to  Procedure(s): CYSTOSCOPY/URETEROSCOPY/HOLMIUM LASER/STENT PLACEMENT (Right) CYSTOSCOPY WITH RETROGRADE PYELOGRAM (Left) as a surgical intervention .  The patient's history has been reviewed, patient examined, no change in status, stable for surgery.  I have reviewed the patient's chart and labs.  Questions were answered to the patient's satisfaction.     RRR CTAB  PReop urine culture negative   Vanna ScotlandAshley Aleida Crandell

## 2015-03-02 ENCOUNTER — Encounter: Payer: Self-pay | Admitting: Urology

## 2015-03-02 ENCOUNTER — Telehealth: Payer: Self-pay | Admitting: Radiology

## 2015-03-02 NOTE — Telephone Encounter (Signed)
Pt notified of surgery scheduled 03/08/15, doesn't need to repeat pre-admit testing appt, advised to call day prior to surgery for arrival time to SDS and to be npo after mn day of surgery. Pt was advised to follow instructions given by pre-admit testing from surgery performed 03/01/15 regarding medications to take/hold day of surgery. Pt voices understanding.

## 2015-03-03 NOTE — Anesthesia Postprocedure Evaluation (Signed)
Anesthesia Post Note  Patient: Gregory Guzman  Procedure(s) Performed: Procedure(s) (LRB): CYSTOSCOPY/URETEROSCOPY (Right) CYSTOSCOPY WITH RETROGRADE PYELOGRAM (Left)  Patient location during evaluation: PACU Anesthesia Type: General Level of consciousness: awake and alert Pain management: pain level controlled Vital Signs Assessment: post-procedure vital signs reviewed and stable Respiratory status: spontaneous breathing, nonlabored ventilation, respiratory function stable and patient connected to nasal cannula oxygen Cardiovascular status: blood pressure returned to baseline and stable Postop Assessment: no signs of nausea or vomiting Anesthetic complications: no    Last Vitals:  Filed Vitals:   03/01/15 1441 03/01/15 1525  BP: 127/98 131/85  Pulse: 89 73  Temp:    Resp: 14 14    Last Pain:  Filed Vitals:   03/02/15 0852  PainSc: 2                  Lenard SimmerAndrew Lady Wisham

## 2015-03-08 ENCOUNTER — Encounter: Payer: Self-pay | Admitting: Anesthesiology

## 2015-03-08 ENCOUNTER — Ambulatory Visit
Admission: RE | Admit: 2015-03-08 | Discharge: 2015-03-08 | Disposition: A | Discharge status: Home / Self Care | Payer: 59 | Source: Ambulatory Visit | Attending: Urology | Admitting: Urology

## 2015-03-08 ENCOUNTER — Ambulatory Visit: Payer: 59 | Admitting: Anesthesiology

## 2015-03-08 ENCOUNTER — Encounter
Admission: RE | Disposition: A | Discharge status: Home / Self Care | Payer: Self-pay | Source: Ambulatory Visit | Attending: Urology

## 2015-03-08 DIAGNOSIS — N200 Calculus of kidney: Secondary | ICD-10-CM | POA: Diagnosis not present

## 2015-03-08 DIAGNOSIS — Z9889 Other specified postprocedural states: Secondary | ICD-10-CM | POA: Diagnosis not present

## 2015-03-08 DIAGNOSIS — G4730 Sleep apnea, unspecified: Secondary | ICD-10-CM | POA: Insufficient documentation

## 2015-03-08 DIAGNOSIS — E785 Hyperlipidemia, unspecified: Secondary | ICD-10-CM | POA: Insufficient documentation

## 2015-03-08 DIAGNOSIS — N5082 Scrotal pain: Secondary | ICD-10-CM | POA: Diagnosis not present

## 2015-03-08 DIAGNOSIS — Z79899 Other long term (current) drug therapy: Secondary | ICD-10-CM | POA: Insufficient documentation

## 2015-03-08 DIAGNOSIS — F329 Major depressive disorder, single episode, unspecified: Secondary | ICD-10-CM | POA: Insufficient documentation

## 2015-03-08 DIAGNOSIS — Z87442 Personal history of urinary calculi: Secondary | ICD-10-CM | POA: Diagnosis not present

## 2015-03-08 DIAGNOSIS — N189 Chronic kidney disease, unspecified: Secondary | ICD-10-CM | POA: Diagnosis not present

## 2015-03-08 DIAGNOSIS — R3129 Other microscopic hematuria: Secondary | ICD-10-CM | POA: Diagnosis not present

## 2015-03-08 HISTORY — PX: CYSTOSCOPY WITH STENT PLACEMENT: SHX5790

## 2015-03-08 HISTORY — DX: Failed or difficult intubation, initial encounter: T88.4XXA

## 2015-03-08 HISTORY — PX: URETEROSCOPY WITH HOLMIUM LASER LITHOTRIPSY: SHX6645

## 2015-03-08 SURGERY — URETEROSCOPY, WITH LITHOTRIPSY USING HOLMIUM LASER
Anesthesia: General | Laterality: Right

## 2015-03-08 MED ORDER — PHENAZOPYRIDINE HCL 200 MG PO TABS: 200.0000 mg | ORAL_TABLET | Freq: Three times a day (TID) | ORAL | Status: DC | PRN

## 2015-03-08 MED ORDER — LABETALOL HCL 5 MG/ML IV SOLN
INTRAVENOUS | Status: AC
  Administered 2015-03-08: 5 mg via INTRAVENOUS
  Filled 2015-03-08: qty 4

## 2015-03-08 MED ORDER — GENTAMICIN IN SALINE 1.6-0.9 MG/ML-% IV SOLN
80.0000 mg | Freq: Once | INTRAVENOUS | Status: DC
  Filled 2015-03-08: qty 50

## 2015-03-08 MED ORDER — LACTATED RINGERS IV SOLN: INTRAVENOUS | Status: DC

## 2015-03-08 MED ORDER — FENTANYL CITRATE (PF) 100 MCG/2ML IJ SOLN
25.0000 ug | INTRAMUSCULAR | Status: DC | PRN
  Administered 2015-03-08: 25 ug via INTRAVENOUS
  Administered 2015-03-08: 50 ug via INTRAVENOUS
  Administered 2015-03-08: 25 ug via INTRAVENOUS

## 2015-03-08 MED ORDER — ONDANSETRON HCL 4 MG/2ML IJ SOLN
INTRAMUSCULAR | Status: DC | PRN
  Administered 2015-03-08: 4 mg via INTRAVENOUS

## 2015-03-08 MED ORDER — OXYCODONE-ACETAMINOPHEN 5-325 MG PO TABS: 1.0000 | ORAL_TABLET | ORAL | Status: DC | PRN

## 2015-03-08 MED ORDER — OXYCODONE HCL 5 MG PO TABS
5.0000 mg | ORAL_TABLET | Freq: Once | ORAL | Status: AC | PRN
  Administered 2015-03-08: 5 mg via ORAL

## 2015-03-08 MED ORDER — PHENYLEPHRINE HCL 10 MG/ML IJ SOLN
INTRAMUSCULAR | Status: DC | PRN
  Administered 2015-03-08: 50 ug via INTRAVENOUS
  Administered 2015-03-08: 100 ug via INTRAVENOUS
  Administered 2015-03-08: 50 ug via INTRAVENOUS

## 2015-03-08 MED ORDER — FENTANYL CITRATE (PF) 100 MCG/2ML IJ SOLN
INTRAMUSCULAR | Status: AC
  Administered 2015-03-08: 50 ug via INTRAVENOUS
  Filled 2015-03-08: qty 2

## 2015-03-08 MED ORDER — SODIUM CHLORIDE 0.9 % IV SOLN
INTRAVENOUS | Status: AC
  Administered 2015-03-08: 1 g via INTRAVENOUS
  Filled 2015-03-08: qty 1000

## 2015-03-08 MED ORDER — MEPERIDINE HCL 25 MG/ML IJ SOLN
6.2500 mg | INTRAMUSCULAR | Status: DC | PRN
  Administered 2015-03-08 (×2): 12.5 mg via INTRAVENOUS

## 2015-03-08 MED ORDER — ROCURONIUM BROMIDE 100 MG/10ML IV SOLN
INTRAVENOUS | Status: DC | PRN
  Administered 2015-03-08: 40 mg via INTRAVENOUS

## 2015-03-08 MED ORDER — FAMOTIDINE 20 MG PO TABS
ORAL_TABLET | ORAL | Status: AC
  Administered 2015-03-08: 20 mg via ORAL
  Filled 2015-03-08: qty 1

## 2015-03-08 MED ORDER — SODIUM CHLORIDE 0.9 % IV SOLN
1.0000 g | Freq: Once | INTRAVENOUS | Status: AC
  Administered 2015-03-08: 1 g via INTRAVENOUS

## 2015-03-08 MED ORDER — OXYCODONE HCL 5 MG PO TABS
ORAL_TABLET | ORAL | Status: AC
  Filled 2015-03-08: qty 1

## 2015-03-08 MED ORDER — PROPOFOL 10 MG/ML IV BOLUS
INTRAVENOUS | Status: DC | PRN
  Administered 2015-03-08: 20 mg via INTRAVENOUS
  Administered 2015-03-08: 160 mg via INTRAVENOUS

## 2015-03-08 MED ORDER — MEPERIDINE HCL 25 MG/ML IJ SOLN
INTRAMUSCULAR | Status: AC
  Filled 2015-03-08: qty 1

## 2015-03-08 MED ORDER — LACTATED RINGERS IV SOLN
INTRAVENOUS | Status: DC | PRN
  Administered 2015-03-08 (×2): via INTRAVENOUS

## 2015-03-08 MED ORDER — GENTAMICIN SULFATE 40 MG/ML IJ SOLN
80.0000 mg | Freq: Once | INTRAVENOUS | Status: DC
  Filled 2015-03-08: qty 2

## 2015-03-08 MED ORDER — SUGAMMADEX SODIUM 200 MG/2ML IV SOLN
INTRAVENOUS | Status: DC | PRN
  Administered 2015-03-08: 125 mg via INTRAVENOUS

## 2015-03-08 MED ORDER — LABETALOL HCL 5 MG/ML IV SOLN
5.0000 mg | Freq: Once | INTRAVENOUS | Status: AC
  Administered 2015-03-08: 5 mg via INTRAVENOUS

## 2015-03-08 MED ORDER — SUCCINYLCHOLINE CHLORIDE 20 MG/ML IJ SOLN
INTRAMUSCULAR | Status: DC | PRN
  Administered 2015-03-08: 100 mg via INTRAVENOUS

## 2015-03-08 MED ORDER — FENTANYL CITRATE (PF) 100 MCG/2ML IJ SOLN
INTRAMUSCULAR | Status: DC | PRN
  Administered 2015-03-08: 100 ug via INTRAVENOUS

## 2015-03-08 MED ORDER — OXYCODONE HCL 5 MG/5ML PO SOLN: 5.0000 mg | Freq: Once | ORAL | Status: AC | PRN

## 2015-03-08 MED ORDER — MIDAZOLAM HCL 2 MG/2ML IJ SOLN
INTRAMUSCULAR | Status: DC | PRN
  Administered 2015-03-08: 2 mg via INTRAVENOUS

## 2015-03-08 MED ORDER — FAMOTIDINE 20 MG PO TABS
20.0000 mg | ORAL_TABLET | Freq: Once | ORAL | Status: AC
  Administered 2015-03-08: 20 mg via ORAL

## 2015-03-08 SURGICAL SUPPLY — 34 items
ADAPTER SCOPE UROLOK II (MISCELLANEOUS) IMPLANT
ADHESIVE MASTISOL STRL (MISCELLANEOUS) ×3 IMPLANT
BAG DRAIN CYSTO-URO LG1000N (MISCELLANEOUS) ×3 IMPLANT
BASKET ZERO TIP 1.9FR (BASKET) IMPLANT
CATH URETL 5X70 OPEN END (CATHETERS) ×3 IMPLANT
CNTNR SPEC 2.5X3XGRAD LEK (MISCELLANEOUS) ×1
CONRAY 43 FOR UROLOGY 50M (MISCELLANEOUS) ×3 IMPLANT
CONT SPEC 4OZ STER OR WHT (MISCELLANEOUS) ×2
CONTAINER SPEC 2.5X3XGRAD LEK (MISCELLANEOUS) ×1 IMPLANT
DRSG TEGADERM 2-3/8X2-3/4 SM (GAUZE/BANDAGES/DRESSINGS) ×3 IMPLANT
GLOVE BIO SURGEON STRL SZ 6.5 (GLOVE) ×2 IMPLANT
GLOVE BIO SURGEON STRL SZ7 (GLOVE) ×6 IMPLANT
GLOVE BIO SURGEONS STRL SZ 6.5 (GLOVE) ×1
GOWN STRL REUS W/ TWL LRG LVL4 (GOWN DISPOSABLE) ×2 IMPLANT
GOWN STRL REUS W/TWL LRG LVL4 (GOWN DISPOSABLE) ×4
GUIDEWIRE GREEN .038 145CM (MISCELLANEOUS) ×3 IMPLANT
INTRODUCER DILATOR DOUBLE (INTRODUCER) IMPLANT
KIT RM TURNOVER CYSTO AR (KITS) ×3 IMPLANT
LASER HOLMIUM 4 IN 1 (MISCELLANEOUS) ×3 IMPLANT
LASER HOLMIUM SU 200UM (MISCELLANEOUS) ×3 IMPLANT
PACK CYSTO AR (MISCELLANEOUS) ×3 IMPLANT
PREP PVP WINGED SPONGE (MISCELLANEOUS) ×3 IMPLANT
PUMP SINGLE ACTION SAP (PUMP) IMPLANT
SENSORWIRE 0.038 NOT ANGLED (WIRE)
SET CYSTO W/LG BORE CLAMP LF (SET/KITS/TRAYS/PACK) ×3 IMPLANT
SHEATH URETERAL 12FRX35CM (MISCELLANEOUS) IMPLANT
SOL .9 NS 3000ML IRR  AL (IV SOLUTION) ×2
SOL .9 NS 3000ML IRR UROMATIC (IV SOLUTION) ×1 IMPLANT
STENT URET 6FRX24 CONTOUR (STENTS) IMPLANT
STENT URET 6FRX26 CONTOUR (STENTS) ×3 IMPLANT
SURGILUBE 2OZ TUBE FLIPTOP (MISCELLANEOUS) ×3 IMPLANT
SYRINGE IRR TOOMEY STRL 70CC (SYRINGE) ×3 IMPLANT
WATER STERILE IRR 1000ML POUR (IV SOLUTION) ×3 IMPLANT
WIRE SENSOR 0.038 NOT ANGLED (WIRE) IMPLANT

## 2015-03-08 NOTE — Discharge Instructions (Signed)
You have a ureteral stent in place.  This is a tube that extends from your kidney to your bladder.  This may cause urinary bleeding, burning with urination, and urinary frequency.  Please call our office or present to the ED if you develop fevers >101 or pain which is not able to be controlled with oral pain medications.  You may be given either Flomax and/ or ditropan to help with bladder spasms and stent pain in addition to pain medications.    Your stent in on a string.  You may remove the stent in 5 days by pulling very gently on the string until the entire stent is removed.  If you have any concerns or troubles, please call our office. If ureteral stent accidentally becomes dislodged prior to 5 days, this should not be a problem unless he develop fevers >100.5 or chills or pain that is not able to be controlled.  [facility] [address], [address] [place], [place] [address] [phone]        AMBULATORY SURGERY  DISCHARGE INSTRUCTIONS   1) The drugs that you were given will stay in your system until tomorrow so for the next 24 hours you should not:  A) Drive an automobile B) Make any legal decisions C) Drink any alcoholic beverage   2) You may resume regular meals tomorrow.  Today it is better to start with liquids and gradually work up to solid foods.  You may eat anything you prefer, but it is better to start with liquids, then soup and crackers, and gradually work up to solid foods.   3) Please notify your doctor immediately if you have any unusual bleeding, trouble breathing, redness and pain at the surgery site, drainage, fever, or pain not relieved by medication. 4)   5) Your post-operative visit with Dr.                                     is: Date:                        Time:    Please call to schedule your post-operative visit.  6) Additional Instructions: 7)  Follow-up Information    Follow up with Vanna ScotlandAshley Brandon, MD In 4 weeks.     Specialty:  Urology   Why:  For follow up with renal ultrasound prior to visit   Contact information:   [address] [place] [address] [phone]

## 2015-03-08 NOTE — Transfer of Care (Signed)
Immediate Anesthesia Transfer of Care Note  Patient: Gregory Guzman  Procedure(s) Performed: Procedure(s): URETEROSCOPY WITH HOLMIUM LASER LITHOTRIPSY (Right) CYSTOSCOPY WITH STENT PLACEMENT (Right)  Patient Location: PACU  Anesthesia Type:General  Level of Consciousness: sedated  Airway & Oxygen Therapy: Patient Spontanous Breathing and Patient connected to face mask oxygen  Post-op Assessment: Report given to RN  Post vital signs: Reviewed and stable  Last Vitals:  Filed Vitals:   03/08/15 1221 03/08/15 1610  BP: 143/96 168/97  Pulse: 90 96  Temp: 36.2 C 36.3 C  Resp: 14 17    Complications: No apparent anesthesia complications

## 2015-03-08 NOTE — Interval H&P Note (Signed)
History and Physical Interval Note:  03/08/2015 2:02 PM  Gregory Guzman  has presented today for surgery, with the diagnosis of KIDNEY STONES  The various methods of treatment have been discussed with the patient and family. After consideration of risks, benefits and other options for treatment, the patient has consented to  Procedure(s): URETEROSCOPY WITH HOLMIUM LASER LITHOTRIPSY (Right) CYSTOSCOPY WITH STENT PLACEMENT (Right) as a surgical intervention .  The patient's history has been reviewed, patient examined, no change in status, stable for surgery.  I have reviewed the patient's chart and labs.  Questions were answered to the patient's satisfaction.    Plan for second attempt at right URS, previously unable to get scope to kidney 2 weeks ago.   RRR CTAB  [name]

## 2015-03-08 NOTE — Op Note (Signed)
Date of procedure: 03/08/2015  Preoperative diagnosis:  1. Right nephrolithiasis   Postoperative diagnosis:  1. Same as above  Procedure: 1. Right ureteroscopy 2. Laser lithotripsy 3. Right ureteral stent exchange  Surgeon: Vanna ScotlandAshley Zackari Ruane, MD  Anesthesia: General  Complications: None  Intraoperative findings: A 9 mm renal pelvis stone and smaller right lower pole stone treated. Proximal ureteral stone not identified.  EBL: Minimal  Specimens: Stone fragment  Drains: 6 x 26 French double-J ureteral stent on right (string left in place)  Indication: Gregory Guzman is a 44 y.o. patient with bilateral nephrolithiasis, right greater than left. He previously underwent attempted right ureteroscopy, however, the scope was unable to be passed all the way to the level of the kidney previously. The stent was placed at that time. He returns today for definitive management of his right-sided stones..  After reviewing the management options for treatment, he elected to proceed with the above surgical procedure(s). We have discussed the potential benefits and risks of the procedure, side effects of the proposed treatment, the likelihood of the patient achieving the goals of the procedure, and any potential problems that might occur during the procedure or recuperation. Informed consent has been obtained.  Description of procedure:  The patient was taken to the operating room and general anesthesia was induced.  The patient was placed in the dorsal lithotomy position, prepped and draped in the usual sterile fashion, and preoperative antibiotics were administered. A preoperative time-out was performed.   A rigid 21 French cystoscope was advanced per urethra into the bladder. The previous ureteral stent was seen emanating from the right UO. The distal end of the coil was grasped using stent graspers and brought to the level of the urethral meatus. This is then cannulated using a sensor wire up to  level of the kidney without difficulty. The stent was removed leaving the wire place. A dual lumen access sheath was used just within the distal ureter to introduce the Super Stiff wire up to the level of the kidney. A rigid ureteroscope was then advanced very easily up to the renal pelvis. At this point in time, a large 9 mm stone was seen within the renal pelvis presumably the upper pole stone which she been knocked into the renal pelvis. A 200  laser fiber was then brought in and using settings of 0.5 J and 25 Hz, the stone was fragmented into very small dust-sized particles. Each and every calyx was then directly visualized. The lower pole stone was identified and fragmented using the same technique. A 1.9 French to plus nitinol basket was then used to grasp 2 larger pieces that were brought down the length of the ureter under direct visualization. The ureter was visualized upon scope removal which revealed no ureteral stones, injuries, or lesions. The scope was then reintroduced up to level of the kidney using the previously described technique. Another few small stone fragments were grasped and removed. At this point in time, the kidney was deemed adequately cleared of stone. The safety wire was then backloaded over a rigid cystoscope and a 6 x 26 French double-J ureteral stent was advanced over the wire to the level of the renal pelvis. The wire was partially withdrawn and a full coil was noted within the renal pelvis. The wire was then fully withdrawn and a coil was noted within the bladder. The bladder was then drained. The stone fragment was passed off the field for stone analysis. The string was left on the stent.  The stent was secured to the patient's glans using Mastisol and Tegaderm. He was then repositioned in the supine position, reversed from anesthesia, and taken to the PACU in stable condition   Plan: Patient will remove his own stent in 5 days. He will follow-up in 4 weeks with a renal  ultrasound prior.  Vanna Scotland, M.D.

## 2015-03-08 NOTE — Anesthesia Preprocedure Evaluation (Signed)
Anesthesia Evaluation  Patient identified by MRN, date of birth, ID band Patient awake    Reviewed: Allergy & Precautions, H&P , NPO status , Patient's Chart, lab work & pertinent test results  History of Anesthesia Complications (+) DIFFICULT AIRWAY and history of anesthetic complications  Airway Mallampati: III  TM Distance: <3 FB Neck ROM: full    Dental no notable dental hx. (+) Teeth Intact   Pulmonary neg shortness of breath, sleep apnea and Continuous Positive Airway Pressure Ventilation ,    Pulmonary exam normal breath sounds clear to auscultation       Cardiovascular Exercise Tolerance: Good (-) angina(-) Past MI and (-) DOE negative cardio ROS Normal cardiovascular exam Rhythm:regular Rate:Normal     Neuro/Psych PSYCHIATRIC DISORDERS negative neurological ROS     GI/Hepatic negative GI ROS, Neg liver ROS,   Endo/Other  negative endocrine ROS  Renal/GU Renal disease  negative genitourinary   Musculoskeletal   Abdominal   Peds  Hematology negative hematology ROS (+)   Anesthesia Other Findings Past Medical History:   Chronic kidney disease                                       Sleep apnea                                                  Allergy                                                      Kidney stone                                                 Chicken pox                                                  Depression                                                   HLD (hyperlipidemia)                                         Rosacea                                                      Hematuria  Past Surgical History:   BACK SURGERY                                                  NASAL SINUS SURGERY                                             Comment:x2   LITHOTRIPSY                                     N/A             CYSTOSCOPY/URETEROSCOPY/HOLMIUM LASER/STENT PL* Right 03/01/2015      Comment:Procedure: CYSTOSCOPY/URETEROSCOPY;  Surgeon:               Vanna ScotlandAshley Brandon, MD;  Location: [facility];                Service: Urology;  Laterality: Right;   CYSTOSCOPY W/ RETROGRADES                       Left 03/01/2015      Comment:Procedure: CYSTOSCOPY WITH RETROGRADE               PYELOGRAM;  Surgeon: Vanna ScotlandAshley Brandon, MD;                Location: [facility];  Service: Urology;                Laterality: Left;     Reproductive/Obstetrics negative OB ROS                             Anesthesia Physical Anesthesia Plan  ASA: III  Anesthesia Plan: General ETT   Post-op Pain Management:    Induction:   Airway Management Planned:   Additional Equipment:   Intra-op Plan:   Post-operative Plan:   Informed Consent: I have reviewed the patients History and Physical, chart, labs and discussed the procedure including the risks, benefits and alternatives for the proposed anesthesia with the patient or authorized representative who has indicated his/her understanding and acceptance.   Dental Advisory Given  Plan Discussed with: Anesthesiologist, CRNA and Surgeon  Anesthesia Plan Comments:         Anesthesia Quick Evaluation

## 2015-03-08 NOTE — H&P (View-Only) (Signed)
02/20/2015 8:43 AM   Gregory Guzman B Bollier [birth year] [id number]  Referring provider: Rafael BihariJohn B Walker III, MD [phone] [facility] [address] [facility] [address], [place] [id number]  Chief Complaint  Patient presents with  . Follow-up    CT results     HPI: Patient is a 44yo male with a history of renal stones and recent complaints of testicular pain and microscopic hematuria.  CT stone study demonstrating bilateral renal calculi with 7 mm stone at the right UPJ causing mild obstructive change. A 9 mm stone is noted within the right collecting system.  Patient presents today to discuss definitive management of his renal stones. He denies any cardiac or pulmonary issues. He does not take any blood thinning medication. He has no prior adverse reactions to general anesthesia.  PMH: Past Medical History  Diagnosis Date  . Chronic kidney disease   . Sleep apnea   . Allergy   . Kidney stone   . Chicken pox   . Depression   . HLD (hyperlipidemia)   . Rosacea   . Hematuria     Surgical History: Past Surgical History  Procedure Laterality Date  . Back surgery    . Nasal sinus surgery      x2    Home Medications:    Medication List       This list is accurate as of: 02/20/15  8:43 AM.  Always use your most recent med list.               citalopram 20 MG tablet  Commonly known as:  CELEXA     EPIPEN 2-PAK 0.3 mg/0.3 mL Soaj injection  Generic drug:  EPINEPHrine  INJECT INTRAMUSCULARLY AS DIRECTED     HYDROcodone-acetaminophen 5-325 MG tablet  Commonly known as:  NORCO/VICODIN  TK 1 T PO Q 6 H PRN P     pravastatin 20 MG tablet  Commonly known as:  PRAVACHOL  TK 1 T PO NIGHTLY        Allergies: No Known Allergies  Family History: Family History  Problem Relation Age of Onset  . Kidney Stones Mother   . Kidney Stones Maternal Grandmother   . Heart disease Father   . Heart disease Paternal Grandmother   . Thyroid disease Sister     Social History:  reports that  he has never smoked. He does not have any smokeless tobacco history on file. He reports that he drinks alcohol. He reports that he does not use illicit drugs.  ROS: UROLOGY Frequent Urination?: No Hard to postpone urination?: No Burning/pain with urination?: No Get up at night to urinate?: No Leakage of urine?: No Urine stream starts and stops?: No Trouble starting stream?: No Do you have to strain to urinate?: No Blood in urine?: Yes Urinary tract infection?: No Sexually transmitted disease?: No Injury to kidneys or bladder?: No Painful intercourse?: No Weak stream?: No Erection problems?: No Penile pain?: No  Gastrointestinal Nausea?: No Vomiting?: No Indigestion/heartburn?: No Diarrhea?: No Constipation?: No  Constitutional Fever: No Night sweats?: No Weight loss?: No Fatigue?: No  Skin Skin rash/lesions?: No Itching?: No  Eyes Blurred vision?: No Double vision?: No  Ears/Nose/Throat Sore throat?: No Sinus problems?: No  Hematologic/Lymphatic Swollen glands?: No Easy bruising?: No  Cardiovascular Leg swelling?: No Chest pain?: No  Respiratory Cough?: No Shortness of breath?: No  Endocrine Excessive thirst?: No  Musculoskeletal Back pain?: No Joint pain?: No  Neurological Headaches?: No Dizziness?: No  Psychologic Depression?: No Anxiety?: No  Physical Exam: BP 128/91 mmHg  Pulse 73  Ht 6' (1.829 m)  Wt 169 lb 1.6 oz (76.703 kg)  BMI 22.93 kg/m2  Constitutional:  Alert and oriented, No acute distress. HEENT: [place] AT, moist mucus membranes.  Trachea midline, no masses. Cardiovascular: No clubbing, cyanosis, or edema, RRR Respiratory: Normal respiratory effort, no increased work of breathing, CTAB GI: Abdomen is soft, nontender, nondistended, no abdominal masses GU: No CVA tenderness.  Skin: No rashes, bruises or suspicious lesions. Neurologic: Grossly intact, no focal deficits, moving all 4 extremities. Psychiatric: Normal mood and  affect.  Laboratory Data:   Urinalysis    Component Value Date/Time   GLUCOSEU Negative 02/06/2015 0924   BILIRUBINUR Negative 02/06/2015 0924   NITRITE Negative 02/06/2015 0924   LEUKOCYTESUR Negative 02/06/2015 0924    Pertinent Imaging: CLINICAL DATA: Bilateral testicular pain left greater than right, history of kidney stones  EXAM: CT ABDOMEN AND PELVIS WITHOUT CONTRAST  TECHNIQUE: Multidetector CT imaging of the abdomen and pelvis was performed following the standard protocol without IV contrast.  COMPARISON: 01/27/2015  FINDINGS: The lung bases are free of acute infiltrate or sizable effusion.  The liver, gallbladder, spleen, adrenal glands and pancreas are within normal limits.  The kidneys are well visualized bilaterally and reveal bilateral nonobstructing renal stones. The largest of these is noted in the upper pole of the right kidney measuring 9 mm in greatest dimension. The left collecting system and left ureter are within normal limits. There is mild prominence of the intrarenal collecting system on the right secondary to a stone at the ureteropelvic junction which measures approximately 7 mm in greatest dimension. The more distal right ureter is within normal limits.  The appendix is within normal limits. Mild aortoiliac calcifications are seen. No significant diverticular disease is noted. The bladder is well distended. No significant lymphadenopathy is noted. No acute bony abnormality is seen.  IMPRESSION: Bilateral renal calculi. A 7 mm stone is noted at the right UPJ causing mild obstructive change. A 9 mm stone is noted within the right collecting system.  No other focal abnormality is seen.  Assessment & Plan:   1. Kidney Stone-  Bilateral renal calculi. A 7 mm stone is noted at the right UPJ causing mild obstructive change. A 9 mm stone is noted within the right collecting system. We discussed various treatment options including  ESWL vs. ureteroscopy, laser lithotripsy, and stent. We discussed the risks and benefits of both including bleeding, infection, damage to surrounding structures, efficacy with need for possible further intervention, and need for temporary ureteral stent.  Patient understands risks and benefits of both procedures. He is opting to proceed with ureteroscopy for removal of right UPJ stone and non obstructing right renal stones if possible . Patient will be scheduled for surgery.  2. Microscopic hematuria- Patient opted for URS.  Will obtain cystoscopy and  left retrograde pyelogram at that time to complete microscopic hematuria workup. - Urinalysis, Complete - CULTURE, URINE COMPREHENSIVE  Pertinent labs & imaging results that were available during my care of the patient were reviewed by me and considered in my medical decision making (see chart for details).  No Follow-up on file.  These notes generated with voice recognition software. I apologize for typographical errors.  Juanisha Bautch, FNP  [facility] [address], [address] [place], [place] [address] [phone]   

## 2015-03-08 NOTE — Anesthesia Procedure Notes (Signed)
Procedure Name: Intubation Date/Time: 03/08/2015 12:45 PM Performed by: [id number], Jamerius Boeckman Pre-anesthesia Checklist: Patient identified, Emergency Drugs available, Suction available, Timeout performed and Patient being monitored Patient Re-evaluated:Patient Re-evaluated prior to inductionPreoxygenation: Pre-oxygenation with 100% oxygen Intubation Type: IV induction and Cricoid Pressure applied Ventilation: Mask ventilation without difficulty Grade View: Grade II Tube type: Oral Tube size: 7.5 mm Number of attempts: 1 Airway Equipment and Method: Stylet Placement Confirmation: ETT inserted through vocal cords under direct vision,  positive ETCO2,  CO2 detector and breath sounds checked- equal and bilateral Secured at: 22 cm Tube secured with: Tape

## 2015-03-09 ENCOUNTER — Encounter: Payer: Self-pay | Admitting: Urology

## 2015-03-10 NOTE — Anesthesia Postprocedure Evaluation (Signed)
Anesthesia Post Note  Patient: Gregory Guzman  Procedure(s) Performed: Procedure(s) (LRB): URETEROSCOPY WITH HOLMIUM LASER LITHOTRIPSY AND RETROGRADE PYLOGRAM (Right) CYSTOSCOPY WITH   STENT PLACEMENT (Right)  Patient location during evaluation: PACU Anesthesia Type: General Level of consciousness: awake Pain management: pain level controlled Vital Signs Assessment: post-procedure vital signs reviewed and stable Respiratory status: spontaneous breathing Cardiovascular status: blood pressure returned to baseline Postop Assessment: no headache Anesthetic complications: no    Last Vitals:  Filed Vitals:   03/08/15 1716 03/08/15 1726  BP: 131/85 129/89  Pulse: 74 83  Temp: 37.2 C 36.7 C  Resp: 16 16    Last Pain:  Filed Vitals:   03/09/15 0932  PainSc: 0-No pain                 Larraine Argo M

## 2015-03-13 LAB — STONE ANALYSIS
CA OXALATE, MONOHYDR.: 85 %
CA PHOS CRY STONE QL IR: 10 %
Ca Oxalate,Dihydrate: 5 %
STONE WEIGHT KSTONE: 7 mg

## 2015-03-31 ENCOUNTER — Ambulatory Visit
Admission: RE | Admit: 2015-03-31 | Discharge: 2015-03-31 | Disposition: A | Discharge status: Home / Self Care | Payer: 59 | Source: Ambulatory Visit | Attending: Urology | Admitting: Urology

## 2015-03-31 DIAGNOSIS — N200 Calculus of kidney: Secondary | ICD-10-CM | POA: Diagnosis not present

## 2015-04-14 ENCOUNTER — Ambulatory Visit (INDEPENDENT_AMBULATORY_CARE_PROVIDER_SITE_OTHER): Payer: 59 | Admitting: Urology

## 2015-04-14 ENCOUNTER — Encounter: Payer: Self-pay | Admitting: Urology

## 2015-04-14 VITALS — BP 149/109 | HR 103 | Ht 72.0 in | Wt 168.2 lb

## 2015-04-14 DIAGNOSIS — N200 Calculus of kidney: Secondary | ICD-10-CM

## 2015-04-14 LAB — URINALYSIS, COMPLETE
Bilirubin, UA: NEGATIVE
GLUCOSE, UA: NEGATIVE
KETONES UA: NEGATIVE
Leukocytes, UA: NEGATIVE
NITRITE UA: NEGATIVE
PROTEIN UA: NEGATIVE
SPEC GRAV UA: 1.025 (ref 1.005–1.030)
UUROB: 0.2 mg/dL (ref 0.2–1.0)
pH, UA: 5.5 (ref 5.0–7.5)

## 2015-04-14 LAB — MICROSCOPIC EXAMINATION: Epithelial Cells (non renal): NONE SEEN /hpf (ref 0–10)

## 2015-04-14 NOTE — Progress Notes (Signed)
04/14/2015 3:13 PM   STEPEN PRINS August 26, 1970 [id number]  Referring provider: [name], MD [phone] [facility] [address] [facility] [place], [place] [address]  Chief Complaint  Patient presents with  . Nephrolithiasis    post litho    HPI: 45 yo with right nephrolithiasis including a 7 mm stone at the right UPJ and 9 mm nonobstructing stone.  He was taken to the operating room on 03/01/2015 for right stent placement and return to the operating room one week later for right ureteroscopy, laser lithotripsy, right ureteral stent placement at which time all of his right-sided stones were treated. He removed his own stent without difficulty.  Stone analysis 90% calcium oxalate,10% calcium phosphate.  He has no complaints today including no dysuria or flank pain.  He does have a personal history of nephrolithiasis, passing 5-6 stones since age of 25. His last episode was greater than 10 years ago at which time he underwent ESWL. This was his only other surgical procedure for stones.  He does eat out several times per week, does not limit his salt intake, and does eat a high animal protein diet.    Follow-up renal ultrasound reviewed today, no evidence of persistent stones or hydronephrosis.  PMH: Past Medical History  Diagnosis Date  . Chronic kidney disease   . Sleep apnea   . Allergy   . Kidney stone   . Chicken pox   . Depression   . HLD (hyperlipidemia)   . Rosacea   . Hematuria   . Difficult intubation     Surgical History: Past Surgical History  Procedure Laterality Date  . Back surgery    . Nasal sinus surgery      x2  . Lithotripsy N/A   . Cystoscopy/ureteroscopy/holmium laser/stent placement Right 03/01/2015    Procedure: CYSTOSCOPY/URETEROSCOPY;  Surgeon: Vanna Scotland, MD;  Location: [facility];  Service: Urology;  Laterality: Right;  . Cystoscopy w/ retrogrades Left 03/01/2015    Procedure: CYSTOSCOPY WITH RETROGRADE PYELOGRAM;  Surgeon: Vanna Scotland, MD;  Location: [facility];  Service: Urology;  Laterality: Left;  . Ureteroscopy with holmium laser lithotripsy Right 03/08/2015    Procedure: URETEROSCOPY WITH HOLMIUM LASER LITHOTRIPSY AND RETROGRADE [id number];  Surgeon: Vanna Scotland, MD;  Location: [facility];  Service: Urology;  Laterality: Right;  . Cystoscopy with stent placement Right 03/08/2015    Procedure: CYSTOSCOPY WITH   STENT PLACEMENT;  Surgeon: Vanna Scotland, MD;  Location: [facility];  Service: Urology;  Laterality: Right;    Home Medications:    Medication List       This list is accurate as of: 04/14/15  3:13 PM.  Always use your most recent med list.               citalopram 20 MG tablet  Commonly known as:  CELEXA  Take 40 mg by mouth every morning.     EPIPEN 2-PAK 0.3 mg/0.3 mL Soaj injection  Generic drug:  EPINEPHrine  Reported on 04/14/2015     pravastatin 20 MG tablet  Commonly known as:  PRAVACHOL  TK 1 T PO NIGHTLY        Allergies: No Known Allergies  Family History: Family History  Problem Relation Age of Onset  . Kidney Stones Mother   . Kidney Stones Maternal Grandmother   . Heart disease Father   . Heart disease Paternal Grandmother   . Thyroid disease Sister     Social History:  reports that he has  never smoked. He does not have any smokeless tobacco history on file. He reports that he drinks alcohol. He reports that he does not use illicit drugs.  ROS: UROLOGY Frequent Urination?: No Hard to postpone urination?: No Burning/pain with urination?: No Get up at night to urinate?: No Leakage of urine?: No Urine stream starts and stops?: No Trouble starting stream?: No Do you have to strain to urinate?: No Blood in urine?: No Urinary tract infection?: No Sexually transmitted disease?: No Injury to kidneys or bladder?: No Painful intercourse?: No Weak stream?: No Erection problems?: No Penile pain?: No  Gastrointestinal Nausea?: No Vomiting?:  No Indigestion/heartburn?: No Diarrhea?: No Constipation?: No  Constitutional Fever: No Night sweats?: No Weight loss?: No Fatigue?: No  Skin Skin rash/lesions?: No Itching?: No  Eyes Blurred vision?: No Double vision?: No  Ears/Nose/Throat Sore throat?: No Sinus problems?: No  Hematologic/Lymphatic Swollen glands?: No Easy bruising?: No  Cardiovascular Leg swelling?: No Chest pain?: No  Respiratory Cough?: No Shortness of breath?: No  Endocrine Excessive thirst?: No  Musculoskeletal Back pain?: No Joint pain?: No  Neurological Headaches?: No Dizziness?: No  Psychologic Depression?: No Anxiety?: No  Physical Exam: BP 149/109 mmHg  Pulse 103  Ht 6' (1.829 m)  Wt 168 lb 3.2 oz (76.295 kg)  BMI 22.81 kg/m2  Constitutional:  Alert and oriented, No acute distress. HEENT: [place] AT, moist mucus membranes.  Trachea midline, no masses. Cardiovascular: No clubbing, cyanosis, or edema. Respiratory: Normal respiratory effort, no increased work of breathing. GI: Abdomen is soft, nontender, nondistended, no abdominal masses.  No CVA tenderness. Skin: No rashes, bruises or suspicious lesions. Neurologic: Grossly intact, no focal deficits, moving all 4 extremities. Psychiatric: Normal mood and affect.  Laboratory Data: Lab Results  Component Value Date   WBC 5.7 02/23/2015   HGB 15.3 02/23/2015   HCT 45.6 02/23/2015   MCV 90.1 02/23/2015   PLT 248 02/23/2015    Lab Results  Component Value Date   CREATININE 1.11 02/23/2015    Urinalysis UA today reviewed, negative.  Pertinent Imaging: LINICAL DATA: Status post right lithotripsy on 03/11/2015, presenting for follow-up.  EXAM: RENAL / URINARY TRACT ULTRASOUND COMPLETE  COMPARISON: 02/15/2015 unenhanced CT abdomen/ pelvis.  FINDINGS: Right Kidney:  Length: 10.2 cm. Echogenicity within normal limits. No perinephric fluid collection. No mass or hydronephrosis visualized. No  shadowing renal stones.  Left Kidney:  Length: 11.3 cm. Echogenicity within normal limits. No mass or hydronephrosis visualized. No shadowing renal stones.  Bladder:  Appears normal for degree of bladder distention.  IMPRESSION: No residual hydronephrosis. No sonographic evidence of residual nephrolithiasis.   Electronically Signed  By: Delbert Phenix M.D.  On: 03/31/2015 13:23  Assessment & Plan:    1. Nephrolithiasis Status post successful right ureteroscopy, laser lithotripsy. No residual stones or hydronephrosis.  We discussed general stone prevention techniques including drinking plenty water with goal of producing 2.5 L urine daily, increased citric acid intake, avoidance of high oxalate containing foods, and decreased salt intake.  Information about dietary recommendations given today.   Given his fairly infrequent stone episodes, we'll defer metabolic workup. Patient was instructed to return on when necessary basis. If he returns with another stone in the near future, we will consider further work up.  - Urinalysis, Complete   Return if symptoms worsen or fail to improve.  Vanna Scotland, MD  [facility] [address], [address] [place], [place] [address] [phone]

## 2016-04-01 DIAGNOSIS — J301 Allergic rhinitis due to pollen: Secondary | ICD-10-CM | POA: Diagnosis not present

## 2016-04-15 DIAGNOSIS — J301 Allergic rhinitis due to pollen: Secondary | ICD-10-CM | POA: Diagnosis not present

## 2016-04-18 DIAGNOSIS — J301 Allergic rhinitis due to pollen: Secondary | ICD-10-CM | POA: Diagnosis not present

## 2016-04-24 DIAGNOSIS — G4733 Obstructive sleep apnea (adult) (pediatric): Secondary | ICD-10-CM | POA: Diagnosis not present

## 2016-04-25 DIAGNOSIS — J301 Allergic rhinitis due to pollen: Secondary | ICD-10-CM | POA: Diagnosis not present

## 2016-04-29 DIAGNOSIS — J301 Allergic rhinitis due to pollen: Secondary | ICD-10-CM | POA: Diagnosis not present

## 2016-05-03 DIAGNOSIS — J301 Allergic rhinitis due to pollen: Secondary | ICD-10-CM | POA: Diagnosis not present

## 2016-05-09 DIAGNOSIS — J301 Allergic rhinitis due to pollen: Secondary | ICD-10-CM | POA: Diagnosis not present

## 2016-05-13 DIAGNOSIS — J301 Allergic rhinitis due to pollen: Secondary | ICD-10-CM | POA: Diagnosis not present

## 2016-05-20 DIAGNOSIS — J301 Allergic rhinitis due to pollen: Secondary | ICD-10-CM | POA: Diagnosis not present

## 2016-05-27 DIAGNOSIS — I10 Essential (primary) hypertension: Secondary | ICD-10-CM | POA: Diagnosis not present

## 2016-05-27 DIAGNOSIS — G4733 Obstructive sleep apnea (adult) (pediatric): Secondary | ICD-10-CM | POA: Diagnosis not present

## 2016-05-27 DIAGNOSIS — J301 Allergic rhinitis due to pollen: Secondary | ICD-10-CM | POA: Diagnosis not present

## 2016-05-27 DIAGNOSIS — E7800 Pure hypercholesterolemia, unspecified: Secondary | ICD-10-CM | POA: Diagnosis not present

## 2016-06-03 DIAGNOSIS — E7800 Pure hypercholesterolemia, unspecified: Secondary | ICD-10-CM | POA: Diagnosis not present

## 2016-06-03 DIAGNOSIS — G4733 Obstructive sleep apnea (adult) (pediatric): Secondary | ICD-10-CM | POA: Diagnosis not present

## 2016-06-03 DIAGNOSIS — I10 Essential (primary) hypertension: Secondary | ICD-10-CM | POA: Diagnosis not present

## 2016-06-06 DIAGNOSIS — J301 Allergic rhinitis due to pollen: Secondary | ICD-10-CM | POA: Diagnosis not present

## 2016-06-06 IMAGING — CT CT RENAL STONE PROTOCOL
2 of 4 series · 16 of 46 positions shown, 18 images · non-contrast
Comparison: 01/27/2015

CLINICAL DATA: Bilateral testicular pain left greater than right,
history of kidney stones

EXAM:
CT ABDOMEN AND PELVIS WITHOUT CONTRAST
TECHNIQUE: Multidetector CT imaging of the abdomen and pelvis was performed
following the standard protocol without IV contrast.

[Series 2: stone · axial · 0.69mm/px · z∈[-1162,-682]mm · 13 of 106 slices shown, 15 images]
[im 5/106  soft-tissue]
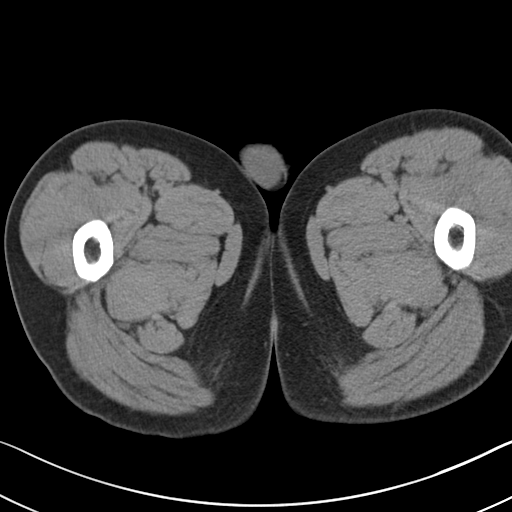
[im 5/106  bone]
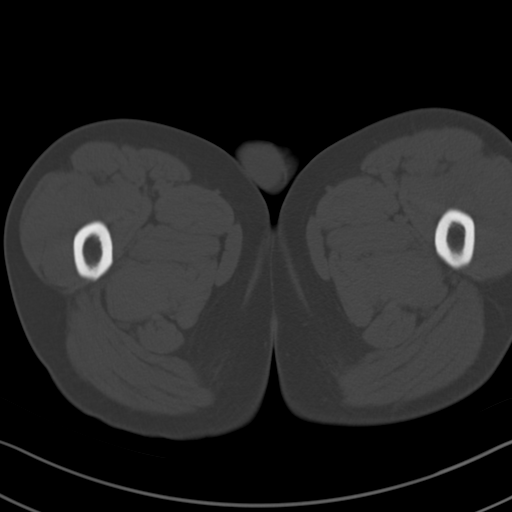
[im 14/106  soft-tissue]
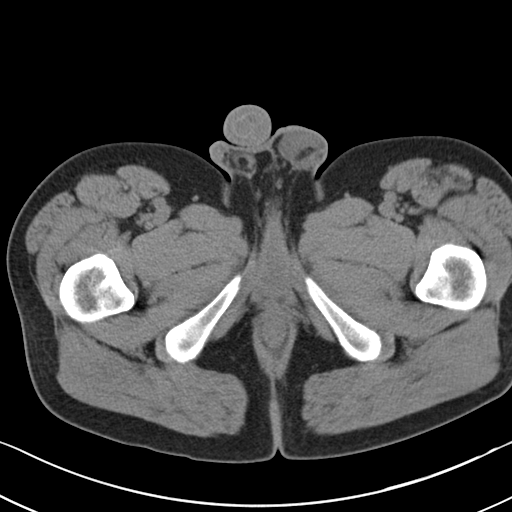
[im 22/106  soft-tissue]
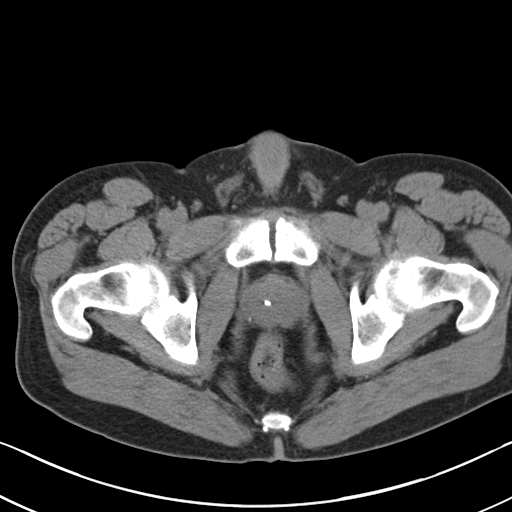
[im 31/106  soft-tissue]
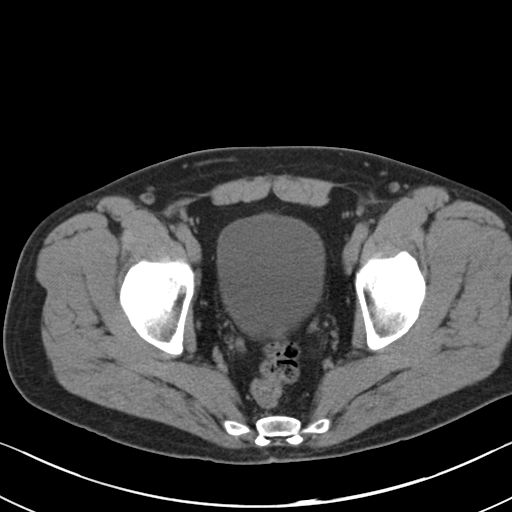
[im 36/106  soft-tissue]
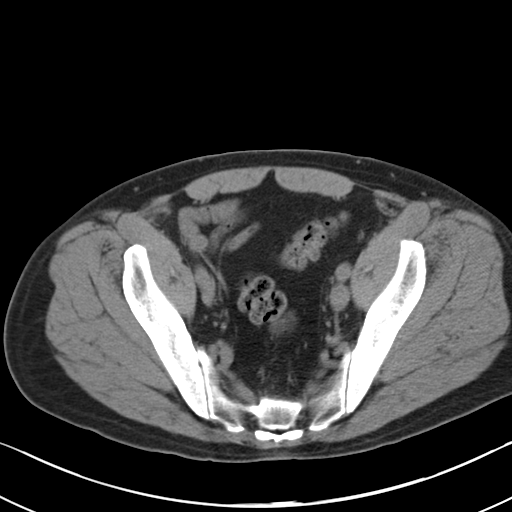
[im 44/106  soft-tissue]
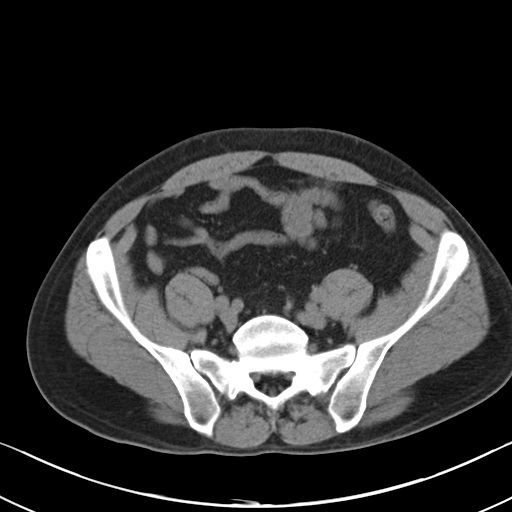
[im 53/106  soft-tissue]
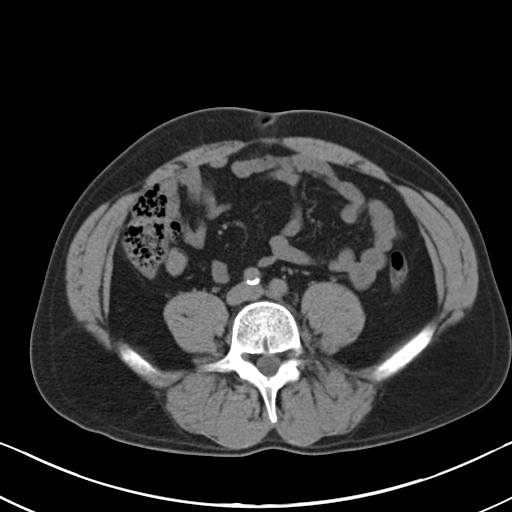
[im 62/106  soft-tissue]
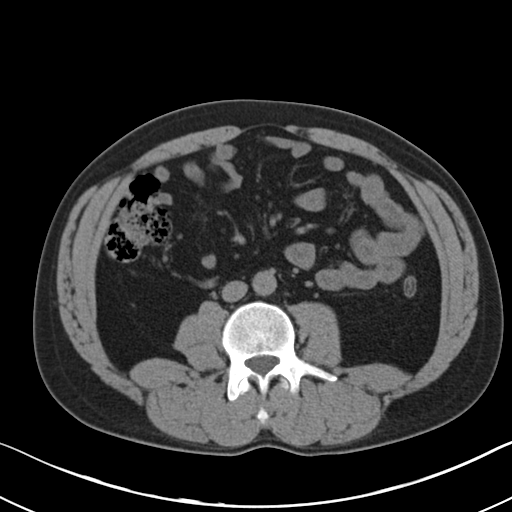
[im 71/106  soft-tissue]
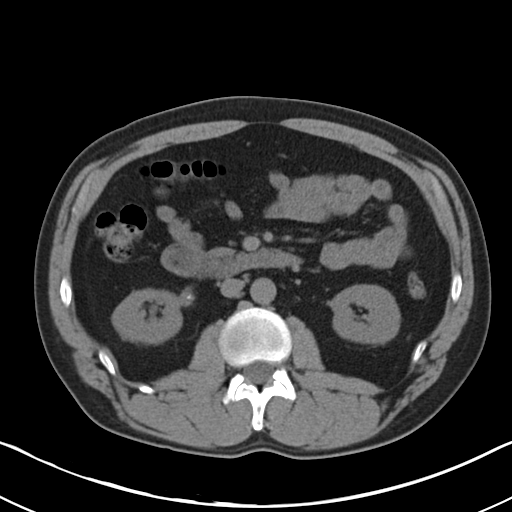
[im 71/106  bone]
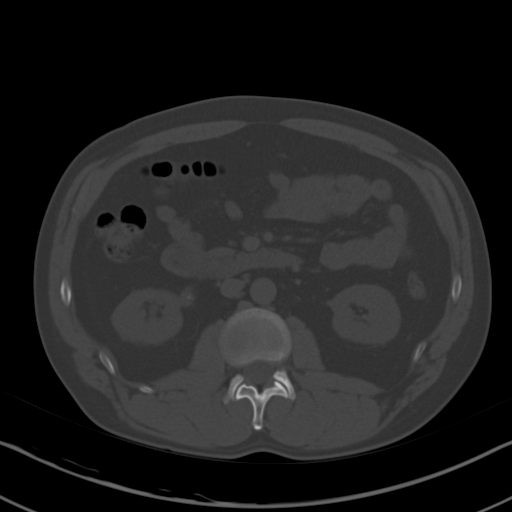
[im 75/106  soft-tissue]
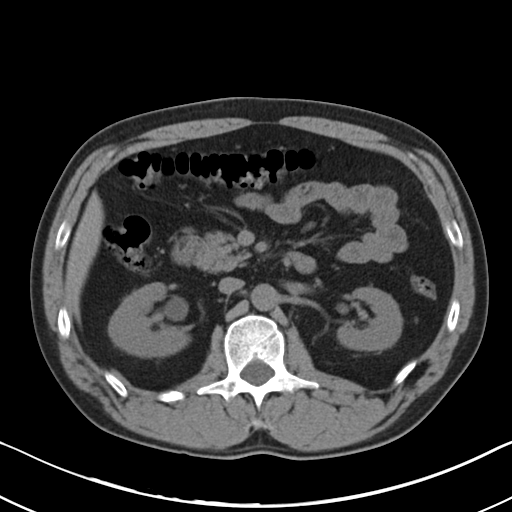
[im 84/106  soft-tissue]
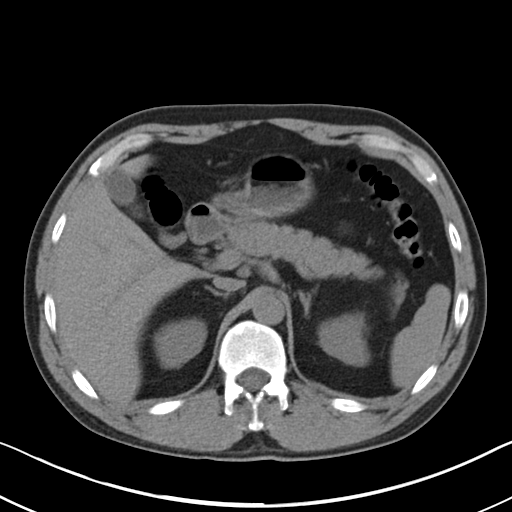
[im 92/106  soft-tissue]
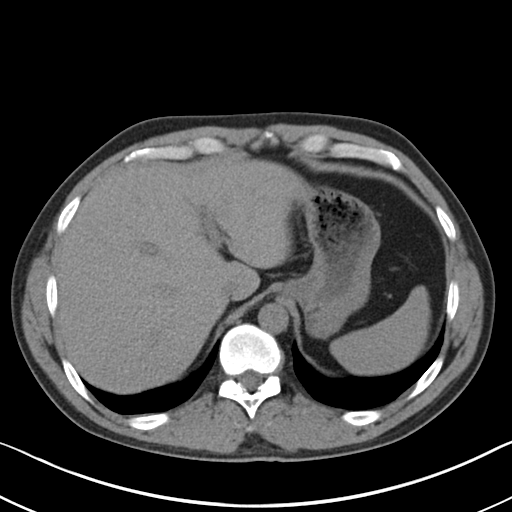
[im 101/106  soft-tissue]
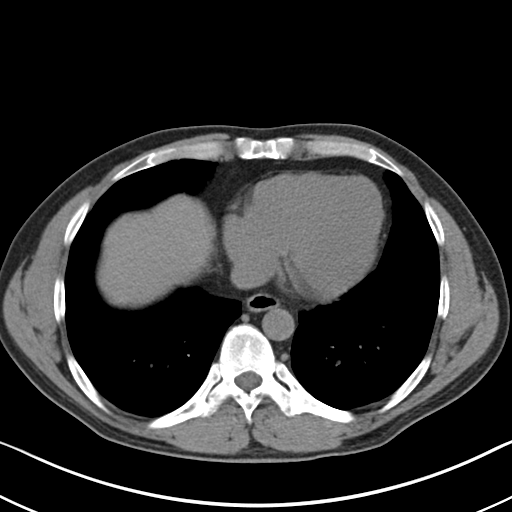

[Series 5: cor · coronal · 0.73mm/px · 3 of 143 slices shown]
[im 48/143  soft-tissue]
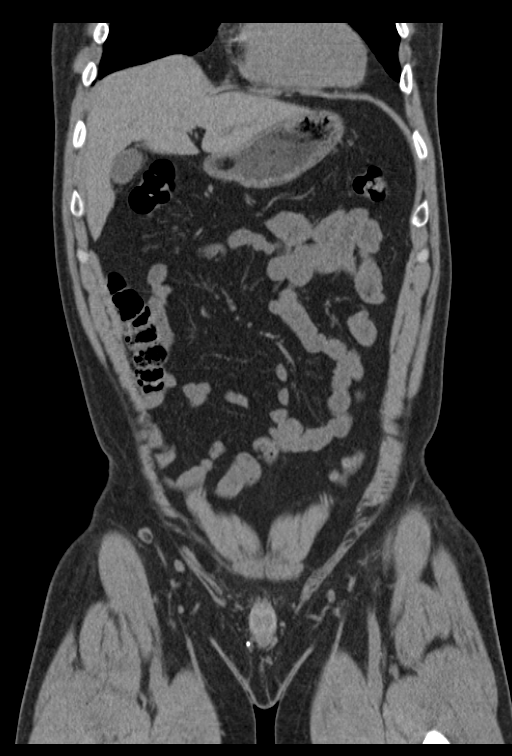
[im 64/143  soft-tissue]
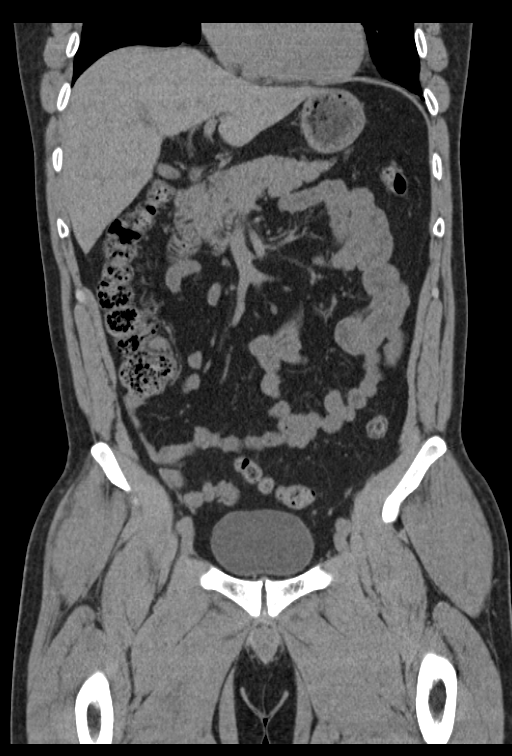
[im 79/143  soft-tissue]
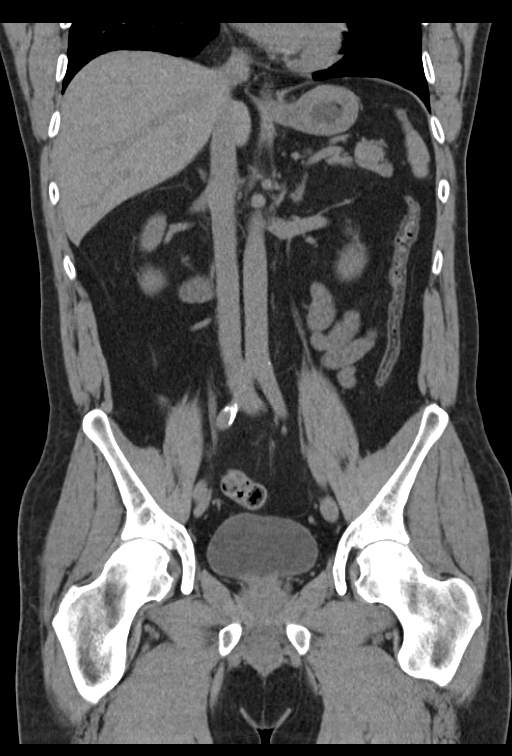

[16 of 46 positions shown; findings below may reference images not displayed]

FINDINGS: The lung bases are free of acute infiltrate or sizable effusion.

The liver, gallbladder, spleen, adrenal glands and pancreas are
within normal limits.

The kidneys are well visualized bilaterally and reveal bilateral
nonobstructing renal stones. The largest of these is noted in the
upper pole of the right kidney measuring 9 mm in greatest dimension.
The left collecting system and left ureter are within normal limits.
There is mild prominence of the intrarenal collecting system on the
right secondary to a stone at the ureteropelvic junction which
measures approximately 7 mm in greatest dimension. The more distal
right ureter is within normal limits.

The appendix is within normal limits. Mild aortoiliac calcifications
are seen. No significant diverticular disease is noted. The bladder
is well distended. No significant lymphadenopathy is noted. No acute
bony abnormality is seen.
IMPRESSION: Bilateral renal calculi. A 7 mm stone is noted at the right UPJ
causing mild obstructive change. A 9 mm stone is noted within the
right collecting system.

No other focal abnormality is seen.

## 2016-06-10 DIAGNOSIS — G4733 Obstructive sleep apnea (adult) (pediatric): Secondary | ICD-10-CM | POA: Diagnosis not present

## 2016-06-10 DIAGNOSIS — J301 Allergic rhinitis due to pollen: Secondary | ICD-10-CM | POA: Diagnosis not present

## 2016-06-12 DIAGNOSIS — J301 Allergic rhinitis due to pollen: Secondary | ICD-10-CM | POA: Diagnosis not present

## 2016-06-20 DIAGNOSIS — J301 Allergic rhinitis due to pollen: Secondary | ICD-10-CM | POA: Diagnosis not present

## 2016-06-27 DIAGNOSIS — J301 Allergic rhinitis due to pollen: Secondary | ICD-10-CM | POA: Diagnosis not present

## 2016-07-01 DIAGNOSIS — J301 Allergic rhinitis due to pollen: Secondary | ICD-10-CM | POA: Diagnosis not present

## 2016-07-04 DIAGNOSIS — J301 Allergic rhinitis due to pollen: Secondary | ICD-10-CM | POA: Diagnosis not present

## 2016-07-05 DIAGNOSIS — J301 Allergic rhinitis due to pollen: Secondary | ICD-10-CM | POA: Diagnosis not present

## 2016-07-08 DIAGNOSIS — J301 Allergic rhinitis due to pollen: Secondary | ICD-10-CM | POA: Diagnosis not present

## 2016-07-15 DIAGNOSIS — J301 Allergic rhinitis due to pollen: Secondary | ICD-10-CM | POA: Diagnosis not present

## 2016-07-24 DIAGNOSIS — G4733 Obstructive sleep apnea (adult) (pediatric): Secondary | ICD-10-CM | POA: Diagnosis not present

## 2016-07-25 DIAGNOSIS — J301 Allergic rhinitis due to pollen: Secondary | ICD-10-CM | POA: Diagnosis not present

## 2016-07-29 DIAGNOSIS — J301 Allergic rhinitis due to pollen: Secondary | ICD-10-CM | POA: Diagnosis not present

## 2016-08-05 DIAGNOSIS — J301 Allergic rhinitis due to pollen: Secondary | ICD-10-CM | POA: Diagnosis not present

## 2016-08-08 DIAGNOSIS — J301 Allergic rhinitis due to pollen: Secondary | ICD-10-CM | POA: Diagnosis not present

## 2016-08-12 DIAGNOSIS — J301 Allergic rhinitis due to pollen: Secondary | ICD-10-CM | POA: Diagnosis not present

## 2016-08-14 DIAGNOSIS — T63421A Toxic effect of venom of ants, accidental (unintentional), initial encounter: Secondary | ICD-10-CM | POA: Diagnosis not present

## 2016-08-14 DIAGNOSIS — R21 Rash and other nonspecific skin eruption: Secondary | ICD-10-CM | POA: Diagnosis not present

## 2016-08-15 DIAGNOSIS — J301 Allergic rhinitis due to pollen: Secondary | ICD-10-CM | POA: Diagnosis not present

## 2016-08-22 DIAGNOSIS — J301 Allergic rhinitis due to pollen: Secondary | ICD-10-CM | POA: Diagnosis not present

## 2016-08-26 DIAGNOSIS — J301 Allergic rhinitis due to pollen: Secondary | ICD-10-CM | POA: Diagnosis not present

## 2016-08-29 DIAGNOSIS — J301 Allergic rhinitis due to pollen: Secondary | ICD-10-CM | POA: Diagnosis not present

## 2016-09-23 DIAGNOSIS — M5431 Sciatica, right side: Secondary | ICD-10-CM | POA: Diagnosis not present

## 2016-09-27 DIAGNOSIS — G4733 Obstructive sleep apnea (adult) (pediatric): Secondary | ICD-10-CM | POA: Diagnosis not present

## 2016-10-07 DIAGNOSIS — L821 Other seborrheic keratosis: Secondary | ICD-10-CM | POA: Diagnosis not present

## 2016-10-07 DIAGNOSIS — B078 Other viral warts: Secondary | ICD-10-CM | POA: Diagnosis not present

## 2016-10-07 DIAGNOSIS — Z86018 Personal history of other benign neoplasm: Secondary | ICD-10-CM | POA: Diagnosis not present

## 2016-10-11 DIAGNOSIS — M545 Low back pain: Secondary | ICD-10-CM | POA: Diagnosis not present

## 2016-10-14 ENCOUNTER — Other Ambulatory Visit: Payer: Self-pay | Admitting: Internal Medicine

## 2016-10-14 DIAGNOSIS — M545 Low back pain, unspecified: Secondary | ICD-10-CM

## 2016-10-14 DIAGNOSIS — M5431 Sciatica, right side: Secondary | ICD-10-CM

## 2016-10-17 ENCOUNTER — Ambulatory Visit
Admission: RE | Admit: 2016-10-17 | Discharge: 2016-10-17 | Disposition: A | Discharge status: Home / Self Care | Payer: 59 | Source: Ambulatory Visit | Attending: Internal Medicine | Admitting: Internal Medicine

## 2016-10-17 DIAGNOSIS — M5431 Sciatica, right side: Secondary | ICD-10-CM | POA: Diagnosis not present

## 2016-10-17 DIAGNOSIS — M545 Low back pain, unspecified: Secondary | ICD-10-CM

## 2016-10-17 DIAGNOSIS — M5127 Other intervertebral disc displacement, lumbosacral region: Secondary | ICD-10-CM | POA: Diagnosis not present

## 2016-10-17 DIAGNOSIS — M5126 Other intervertebral disc displacement, lumbar region: Secondary | ICD-10-CM | POA: Insufficient documentation

## 2016-10-24 DIAGNOSIS — M5416 Radiculopathy, lumbar region: Secondary | ICD-10-CM | POA: Diagnosis not present

## 2016-10-29 DIAGNOSIS — G4733 Obstructive sleep apnea (adult) (pediatric): Secondary | ICD-10-CM | POA: Diagnosis not present

## 2016-10-31 ENCOUNTER — Encounter: Payer: Self-pay | Admitting: Student in an Organized Health Care Education/Training Program

## 2016-10-31 ENCOUNTER — Ambulatory Visit
Payer: 59 | Attending: Student in an Organized Health Care Education/Training Program | Admitting: Student in an Organized Health Care Education/Training Program

## 2016-10-31 VITALS — BP 137/102 | HR 76 | Temp 97.9°F | Resp 16 | Ht 72.0 in | Wt 172.0 lb

## 2016-10-31 DIAGNOSIS — M9983 Other biomechanical lesions of lumbar region: Secondary | ICD-10-CM

## 2016-10-31 DIAGNOSIS — M545 Low back pain, unspecified: Secondary | ICD-10-CM

## 2016-10-31 DIAGNOSIS — Z8249 Family history of ischemic heart disease and other diseases of the circulatory system: Secondary | ICD-10-CM | POA: Diagnosis not present

## 2016-10-31 DIAGNOSIS — Z87442 Personal history of urinary calculi: Secondary | ICD-10-CM | POA: Diagnosis not present

## 2016-10-31 DIAGNOSIS — F329 Major depressive disorder, single episode, unspecified: Secondary | ICD-10-CM | POA: Insufficient documentation

## 2016-10-31 DIAGNOSIS — M5116 Intervertebral disc disorders with radiculopathy, lumbar region: Secondary | ICD-10-CM | POA: Insufficient documentation

## 2016-10-31 DIAGNOSIS — Z8349 Family history of other endocrine, nutritional and metabolic diseases: Secondary | ICD-10-CM | POA: Insufficient documentation

## 2016-10-31 DIAGNOSIS — N200 Calculus of kidney: Secondary | ICD-10-CM | POA: Diagnosis not present

## 2016-10-31 DIAGNOSIS — G4730 Sleep apnea, unspecified: Secondary | ICD-10-CM | POA: Insufficient documentation

## 2016-10-31 DIAGNOSIS — Z79899 Other long term (current) drug therapy: Secondary | ICD-10-CM | POA: Diagnosis not present

## 2016-10-31 DIAGNOSIS — E785 Hyperlipidemia, unspecified: Secondary | ICD-10-CM | POA: Insufficient documentation

## 2016-10-31 DIAGNOSIS — M48061 Spinal stenosis, lumbar region without neurogenic claudication: Secondary | ICD-10-CM | POA: Insufficient documentation

## 2016-10-31 DIAGNOSIS — M5136 Other intervertebral disc degeneration, lumbar region: Secondary | ICD-10-CM | POA: Diagnosis not present

## 2016-10-31 DIAGNOSIS — G8929 Other chronic pain: Secondary | ICD-10-CM | POA: Diagnosis not present

## 2016-10-31 DIAGNOSIS — N189 Chronic kidney disease, unspecified: Secondary | ICD-10-CM | POA: Diagnosis not present

## 2016-10-31 DIAGNOSIS — M5416 Radiculopathy, lumbar region: Secondary | ICD-10-CM

## 2016-10-31 NOTE — Addendum Note (Signed)
Addended by: Edward JollyLATEEF, Canio Winokur on: 10/31/2016 09:26 AM   Modules accepted: Orders

## 2016-10-31 NOTE — Progress Notes (Addendum)
Patient's Name: Gregory Guzman  MRN: [id number]  Referring Provider: Madelyn Brunner, MD  DOB: [birth year]  PCP: Madelyn Brunner, MD  DOS: 10/31/2016  Note by: Gillis Santa, MD  Service setting: Ambulatory outpatient  Specialty: Interventional Pain Management  Location: [facility] (AMB) Pain Management Facility  Visit type: Initial Patient Evaluation  Patient type: New Patient   Primary Reason(s) for Visit: Encounter for initial evaluation of one or more chronic problems (new to examiner) potentially causing chronic pain, and posing a threat to normal musculoskeletal function. (Level of risk: High) CC: Back Pain (lower)  HPI  Gregory Guzman is a 46 y.o. year old, male patient, who comes today to see [place] for the first time for an initial evaluation of his chronic pain. He has Clinical depression; HLD (hyperlipidemia); and Apnea, sleep on his problem list. Today he comes in for evaluation of his Back Pain (lower)  Pain Assessment: Location: Lower Back Radiating: right leg, back of upper and lower leg, with numbness in foot Onset: More than a month ago Duration: Chronic pain Quality: Sharp, Tightness (deep) Severity: 6 /10 (self-reported pain score)  Note: Reported level is compatible with observation.                   Effect on ADL:   Timing: Intermittent Modifying factors: finding the right position, Aleve  Onset and Duration: Sudden, Gradual and Present longer than 3 months Cause of pain: Unknown Severity: Getting worse, NAS-11 at its worse: 10/10, NAS-11 at its best: 0/10, NAS-11 now: 7/10 and NAS-11 on the average: 5/10 Timing: After activity or exercise and After a period of immobility Aggravating Factors: Bending and Bowel movements Alleviating Factors: Lying down Associated Problems: Numbness and Weakness Quality of Pain: Intermittent, Deep, Nagging, Sharp and Sickening Previous Examinations or Tests: MRI scan, X-rays and Neurosurgical evaluation Previous Treatments: Steroid treatments by  mouth  The patient comes into the clinics today for the first time for a chronic pain management evaluation.   Gregory Guzman is a 46 year old male with a past medical history of L4-L5 microdiscectomy done by Dr. March Rummage at the age of 89 when the patient sustained a herniated disc after lifting a multi gallon fish tank. Patient states that his low back pain and right greater than left leg pain has worsened over the last year. He often wakes up in pain levels ranging from 7-8 out of 10. He states that over the last year his painful days out of the week have become greater in number. Patient did have a recent lumbar MRI done with results shown below. He has already seen Dr. Cari Caraway with neurosurgery who recommended conservative management including physical therapy and/or possible neuraxial injections which the patient is seeing me for.  No red flags including lower extremity weakness, bladder or bowel incontinence. No sensory deficits or obvious neurological deficits.  Today I took the time to provide the patient with information regarding my pain practice. The patient was informed that my practice is divided into two sections: an interventional pain management section, as well as a completely separate and distinct medication management section. I explained that I have procedure days for my interventional therapies, and evaluation days for follow-ups and medication management. Because of the amount of documentation required during both, they are kept separated. This means that there is the possibility that he may be scheduled for a procedure on one day, and medication management the next. I have also informed him that because of staffing and  facility limitations, I no longer take patients for medication management only. To illustrate the reasons for this, I gave the patient the example of surgeons, and how inappropriate it would be to refer a patient to his/her care, just to write for the post-surgical antibiotics  on a surgery done by a different surgeon.   Because interventional pain management is my board-certified specialty, the patient was informed that joining my practice means that they are open to any and all interventional therapies. I made it clear that this does not mean that they will be forced to have any procedures done. What this means is that I believe interventional therapies to be essential part of the diagnosis and proper management of chronic pain conditions. Therefore, patients not interested in these interventional alternatives will be better served under the care of a different practitioner.  The patient was also made aware of my Comprehensive Pain Management Safety Guidelines where by joining my practice, they limit all of their nerve blocks and joint injections to those done by our practice, for as long as we are retained to manage their care.   Historic Controlled Substance Pharmacotherapy Review  PMP and historical list of controlled substances: Controlled substance database was checked, Last fill was Percocet 5 mg, quantity 10, fill date 03/08/2015 Medications: The patient did not bring the medication(s) to the appointment, as requested in our "New Patient Package" Pharmacodynamics: Desired effects: Analgesia: The patient reports >50% benefit. Reported improvement in function: The patient reports medication allows him to accomplish basic ADLs. Clinically meaningful improvement in function (CMIF): Sustained CMIF goals met Perceived effectiveness: Described as relatively effective, allowing for increase in activities of daily living (ADL) Undesirable effects: Side-effects or Adverse reactions: None reported Historical Monitoring: The patient  reports that he does not use drugs. List of all UDS Test(s): No results found for: MDMA, COCAINSCRNUR, PCPSCRNUR, PCPQUANT, CANNABQUANT, THCU, [place] List of all Serum Drug Screening Test(s):  No results found for: AMPHSCRSER, BARBSCRSER,  BENZOSCRSER, COCAINSCRSER, PCPSCRSER, PCPQUANT, THCSCRSER, CANNABQUANT, OPIATESCRSER, OXYSCRSER, PROPOXSCRSER Historical Background Evaluation: [place] PDMP: Six (6) year initial data search conducted.             [place] Department of public safety, offender search: Editor, commissioning Information) Non-contributory Risk Assessment Profile: Aberrant behavior: None observed or detected today Risk factors for fatal opioid overdose: None identified today Fatal overdose hazard ratio (HR): Calculation deferred Non-fatal overdose hazard ratio (HR): Calculation deferred Risk of opioid abuse or dependence: 0.7-3.0% with doses ? 36 MME/day and 6.1-26% with doses ? 120 MME/day. Substance use disorder (SUD) risk level: Pending results of Medical Psychology Evaluation for SUD Opioid risk tool (ORT) (Total Score): 1     Opioid Risk Tool - 10/31/16 0810      Family History of Substance Abuse   Alcohol Negative   Illegal Drugs Negative   Rx Drugs Negative     Personal History of Substance Abuse   Alcohol Negative   Illegal Drugs Negative   Rx Drugs Negative     Age   Age between 5-45 years  No     History of Preadolescent Sexual Abuse   History of Preadolescent Sexual Abuse Negative or Male     Psychological Disease   Psychological Disease Negative   Depression Positive     Total Score   Opioid Risk Tool Scoring 1   Opioid Risk Interpretation Low Risk     ORT Scoring interpretation table:  Score <3 = Low Risk for SUD  Score between 4-7 = Moderate  Risk for SUD  Score >8 = High Risk for Opioid Abuse   PHQ-2 Depression Scale:  Total score: 0  PHQ-2 Scoring interpretation table: (Score and probability of major depressive disorder)  Score 0 = No depression  Score 1 = 15.4% Probability  Score 2 = 21.1% Probability  Score 3 = 38.4% Probability  Score 4 = 45.5% Probability  Score 5 = 56.4% Probability  Score 6 = 78.6% Probability   PHQ-9 Depression Scale:  Total score: 0  PHQ-9 Scoring interpretation  table:  Score 0-4 = No depression  Score 5-9 = Mild depression  Score 10-14 = Moderate depression  Score 15-19 = Moderately severe depression  Score 20-27 = Severe depression (2.4 times higher risk of SUD and 2.89 times higher risk of overuse)   Pharmacologic Plan: Pending ordered tests and/or consults            Initial impression: Pending review of available data and ordered tests.  Meds   Current Meds  Medication Sig  . citalopram (CELEXA) 20 MG tablet Take 40 mg by mouth every morning.   Marland Kitchen EPINEPHrine (EPIPEN 2-PAK) 0.3 mg/0.3 mL IJ SOAJ injection Reported on 04/14/2015  . losartan (COZAAR) 25 MG tablet Take 25 mg by mouth daily.  . pravastatin (PRAVACHOL) 20 MG tablet TK 1 T PO NIGHTLY    Imaging Review    Lumbosacral Imaging: Lumbar MR wo contrast:  Results for orders placed during the hospital encounter of 10/17/16  MR LUMBAR SPINE WO CONTRAST   Narrative CLINICAL DATA:  Low back pain and right-sided leg pain  EXAM: MRI LUMBAR SPINE WITHOUT CONTRAST  TECHNIQUE: Multiplanar, multisequence MR imaging of the lumbar spine was performed. No intravenous contrast was administered.  COMPARISON:  None.  FINDINGS: Segmentation:  Normal  Alignment:  Normal  Vertebrae: Modic type 2 discogenic endplate signal changes at L4-L5. No acute abnormality. No focal marrow lesion. No facet edema.  Conus medullaris: Extends to the L1 level and appears normal.  Paraspinal and other soft tissues: Normal.  Disc levels:  T12-L1: Normal disc space and facets. No spinal canal or neuroforaminal stenosis.  L1-L2: Normal disc space and facets. No spinal canal or neuroforaminal stenosis.  L2-L3: Normal disc space and facets. No spinal canal or neuroforaminal stenosis.  L3-L4: There is a left extraforaminal disc protrusion with annular fissure. This is in close proximity to the exiting left L3 nerve root but does not visibly contact the nerve. No spinal canal or neural foraminal  stenosis.  L4-L5: Mild diffuse disc bulge. No spinal canal, neural foraminal or lateral recess stenosis. There is a small annular fissure in a left foraminal location.  L5-S1: Small central disc extrusion with superior migration of approximately 6 mm. This is not cause spinal canal or lateral recess stenosis. There is no neural foraminal stenosis.  Visualized sacrum: Normal.  IMPRESSION: 1. Left foraminal and extraforaminal disc protrusions at L4-5 and L3-4, respectively. No nerve root contact/impingement, but annular fissures at these locations could serve as a source of irritation to the exiting L3 and L4 nerve roots. 2. Small central disc extrusion with superior migration of 6 mm at L5-S1, without associated spinal canal or lateral recess stenosis. 3. No lumbar spinal canal stenosis.   Electronically Signed   By: Ulyses Jarred M.D.   On: 10/17/2016 13:59     Complexity Note: Imaging results reviewed. Results discussed using Layman's terms.               ROS  Cardiovascular  History: High blood pressure Pulmonary or Respiratory History: Snoring  and Temporary stoppage of breathing during sleep Neurological History: No reported neurological signs or symptoms such as seizures, abnormal skin sensations, urinary and/or fecal incontinence, being born with an abnormal open spine and/or a tethered spinal cord Review of Past Neurological Studies: No results found for this or any previous visit. Psychological-Psychiatric History: Anxiousness, Depressed and Prone to panicking Gastrointestinal History: Inflamed liver (Hepatitis) Genitourinary History: Passing kidney stones Hematological History: No reported hematological signs or symptoms such as prolonged bleeding, low or poor functioning platelets, bruising or bleeding easily, hereditary bleeding problems, low energy levels due to low hemoglobin or being anemic Endocrine History: No reported endocrine signs or symptoms such as high or  low blood sugar, rapid heart rate due to high thyroid levels, obesity or weight gain due to slow thyroid or thyroid disease Rheumatologic History: No reported rheumatological signs and symptoms such as fatigue, joint pain, tenderness, swelling, redness, heat, stiffness, decreased range of motion, with or without associated rash Musculoskeletal History: Negative for myasthenia gravis, muscular dystrophy, multiple sclerosis or malignant hyperthermia Work History: Working full time  Allergies  Mr. Amadon has No Known Allergies.  Laboratory Chemistry  Inflammation Markers (CRP: Acute Phase) (ESR: Chronic Phase) No results found for: CRP, ESRSEDRATE               Renal Function Markers Lab Results  Component Value Date   BUN 12 02/23/2015   CREATININE 1.11 02/23/2015   GFRAA >60 02/23/2015   GFRNONAA >60 02/23/2015                 Hepatic Function Markers No results found for: AST, ALT, ALBUMIN, ALKPHOS, HCVAB               Electrolytes Lab Results  Component Value Date   NA 140 02/23/2015   K 4.7 02/23/2015   CL 107 02/23/2015   CALCIUM 8.8 (L) 02/23/2015                 Neuropathy Markers No results found for: [id number]               Bone Pathology Markers Lab Results  Component Value Date   CALCIUM 8.8 (L) 02/23/2015                 Coagulation Parameters Lab Results  Component Value Date   PLT 248 02/23/2015                 Cardiovascular Markers Lab Results  Component Value Date   HGB 15.3 02/23/2015   HCT 45.6 02/23/2015                 Note: Lab results reviewed.  PFSH  Drug: Mr. Baltzell  reports that he does not use drugs. Alcohol:  reports that he drinks alcohol. Tobacco:  reports that he has never smoked. He has never used smokeless tobacco. Medical:  has a past medical history of Allergy; Chicken pox; Chronic kidney disease; Depression; Difficult intubation; Hematuria; HLD (hyperlipidemia); Kidney stone; Rosacea; and Sleep apnea. Family: family history  includes Heart disease in his father and paternal grandmother; Kidney Stones in his maternal grandmother and mother; Thyroid disease in his sister.  Past Surgical History:  Procedure Laterality Date  . BACK SURGERY    . CYSTOSCOPY W/ RETROGRADES Left 03/01/2015   Procedure: CYSTOSCOPY WITH RETROGRADE PYELOGRAM;  Surgeon: Hollice Espy, MD;  Location: [facility];  Service: Urology;  Laterality: Left;  .  CYSTOSCOPY WITH STENT PLACEMENT Right 03/08/2015   Procedure: CYSTOSCOPY WITH   STENT PLACEMENT;  Surgeon: Hollice Espy, MD;  Location: [facility];  Service: Urology;  Laterality: Right;  . CYSTOSCOPY/URETEROSCOPY/HOLMIUM LASER/STENT PLACEMENT Right 03/01/2015   Procedure: CYSTOSCOPY/URETEROSCOPY;  Surgeon: Hollice Espy, MD;  Location: [facility];  Service: Urology;  Laterality: Right;  . LITHOTRIPSY N/A   . NASAL SINUS SURGERY     x2  . URETEROSCOPY WITH HOLMIUM LASER LITHOTRIPSY Right 03/08/2015   Procedure: URETEROSCOPY WITH HOLMIUM LASER LITHOTRIPSY AND RETROGRADE [id number];  Surgeon: Hollice Espy, MD;  Location: [facility];  Service: Urology;  Laterality: Right;   Active Ambulatory Problems    Diagnosis Date Noted  . Clinical depression 12/01/2013  . HLD (hyperlipidemia) 06/01/2014  . Apnea, sleep 12/01/2013   Resolved Ambulatory Problems    Diagnosis Date Noted  . No Resolved Ambulatory Problems   Past Medical History:  Diagnosis Date  . Allergy   . Chicken pox   . Chronic kidney disease   . Depression   . Difficult intubation   . Hematuria   . HLD (hyperlipidemia)   . Kidney stone   . Rosacea   . Sleep apnea    Constitutional Exam  General appearance: Well nourished, well developed, and well hydrated. In no apparent acute distress Vitals:   10/31/16 0803  BP: (!) 137/102  Pulse: 76  Resp: 16  Temp: 97.9 F (36.6 C)  TempSrc: Oral  SpO2: 100%  Weight: 172 lb (78 kg)  Height: 6' (1.829 m)   BMI Assessment: Estimated body mass index is 23.33 kg/m as calculated  from the following:   Height as of this encounter: 6' (1.829 m).   Weight as of this encounter: 172 lb (78 kg).  BMI interpretation table: BMI level Category Range association with higher incidence of chronic pain  <18 kg/m2 Underweight   18.5-24.9 kg/m2 Ideal body weight   25-29.9 kg/m2 Overweight Increased incidence by 20%  30-34.9 kg/m2 Obese (Class I) Increased incidence by 68%  35-39.9 kg/m2 Severe obesity (Class II) Increased incidence by 136%  >40 kg/m2 Extreme obesity (Class III) Increased incidence by 254%   BMI Readings from Last 4 Encounters:  10/31/16 23.33 kg/m  04/14/15 22.81 kg/m  02/20/15 22.93 kg/m  02/06/15 23.12 kg/m   Wt Readings from Last 4 Encounters:  10/31/16 172 lb (78 kg)  04/14/15 168 lb 3.2 oz (76.3 kg)  02/20/15 169 lb 1.6 oz (76.7 kg)  02/06/15 170 lb 8 oz (77.3 kg)  Psych/Mental status: Alert, oriented x 3 (person, place, & time)       Eyes: PERLA Respiratory: No evidence of acute respiratory distress  Cervical Spine Area Exam  Skin & Axial Inspection: No masses, redness, edema, swelling, or associated skin lesions Alignment: Symmetrical Functional ROM: Unrestricted ROM      Stability: No instability detected Muscle Tone/Strength: Functionally intact. No obvious neuro-muscular anomalies detected. Sensory (Neurological): Unimpaired Palpation: No palpable anomalies              Upper Extremity (UE) Exam    Side: Right upper extremity  Side: Left upper extremity  Skin & Extremity Inspection: Skin color, temperature, and hair growth are WNL. No peripheral edema or cyanosis. No masses, redness, swelling, asymmetry, or associated skin lesions. No contractures.  Skin & Extremity Inspection: Skin color, temperature, and hair growth are WNL. No peripheral edema or cyanosis. No masses, redness, swelling, asymmetry, or associated skin lesions. No contractures.  Functional ROM: Unrestricted ROM  Functional ROM: Unrestricted ROM          Muscle  Tone/Strength: Functionally intact. No obvious neuro-muscular anomalies detected.  Muscle Tone/Strength: Functionally intact. No obvious neuro-muscular anomalies detected.  Sensory (Neurological): Unimpaired  Sensory (Neurological): Unimpaired  Palpation: No palpable anomalies              Palpation: No palpable anomalies              Specialized Test(s): Deferred         Specialized Test(s): Deferred          Thoracic Spine Area Exam  Skin & Axial Inspection: No masses, redness, or swelling Alignment: Symmetrical Functional ROM: Unrestricted ROM Stability: No instability detected Muscle Tone/Strength: Functionally intact. No obvious neuro-muscular anomalies detected. Sensory (Neurological): Unimpaired Muscle strength & Tone: No palpable anomalies  Lumbar Spine Area Exam  Skin & Axial Inspection: No masses, redness, or swelling Alignment: Symmetrical Functional ROM: Unrestricted ROM      Stability: No instability detected Muscle Tone/Strength: Functionally intact. No obvious neuro-muscular anomalies detected. Sensory (Neurological): Unimpaired Palpation: No palpable anomalies       Provocative Tests: Lumbar Hyperextension and rotation test: Positive       Lumbar Lateral bending test: Positive       Patrick's Maneuver: evaluation deferred today                   Positive straight leg raise test, right greater than left Gait & Posture Assessment  Ambulation: Unassisted Gait: Relatively normal for age and body habitus Posture: WNL   Lower Extremity Exam    Side: Right lower extremity  Side: Left lower extremity  Skin & Extremity Inspection: Skin color, temperature, and hair growth are WNL. No peripheral edema or cyanosis. No masses, redness, swelling, asymmetry, or associated skin lesions. No contractures.  Skin & Extremity Inspection: Skin color, temperature, and hair growth are WNL. No peripheral edema or cyanosis. No masses, redness, swelling, asymmetry, or associated skin lesions.  No contractures.  Functional ROM: Unrestricted ROM          Functional ROM: Unrestricted ROM          Muscle Tone/Strength: Functionally intact. No obvious neuro-muscular anomalies detected.  Muscle Tone/Strength: Functionally intact. No obvious neuro-muscular anomalies detected.  Sensory (Neurological): Unimpaired  Sensory (Neurological): Unimpaired  Palpation: No palpable anomalies  Palpation: No palpable anomalies   Assessment  Primary Diagnosis & Pertinent Problem List: The primary encounter diagnosis was Lumbar radiculopathy, chronic. Diagnoses of Lumbar degenerative disc disease, Neuroforaminal stenosis of lumbar spine, Lumbosacral pain, chronic, and Nephrolithiasis were also pertinent to this visit.  Visit Diagnosis (New problems to examiner): 1. Lumbar radiculopathy, chronic   2. Lumbar degenerative disc disease   3. Neuroforaminal stenosis of lumbar spine   4. Lumbosacral pain, chronic   5. Nephrolithiasis    Plan of Care (Initial workup plan)  Note: Please be advised that as per protocol, today's visit has been an evaluation only. We have not taken over the patient's controlled substance management.  Patient is a 46 year old male with a past medical history of lumbar microdiscectomy at L4-L5 approximately 27 years ago secondary to a herniated disc from a lifting accident. Patient's pain over the last 10-12 months has worsened. He describes an achy throbbing pain in his back that radiates down his right posterior thigh into his posterior calf and occasionally in his toes. No red flags including bowel/bladder incontinence, motor weakness, sensory abnormalities. Patient has been evaluated by neurosurgery, Dr. Cari Caraway,  who recommended physical therapy and possible neuraxial intervention.  MRI findings and patient's clinical history and exam are consistent with an acute on chronic lumbosacral radiculopathy, right greater than left, affecting L4-L5 and L5-S1. Patient's MRI shows left  foraminal and foraminal disc protrusions at L4-L5 and L3-L4. He also has a small central disc extrusion with superior migration of 6 mm at the L5-S1 without any spinal canal or lateral recess stenosis. Patient does not have any central lumbar spinal canal stenosis.  Patient could benefit from physical therapy, with focus on McKenzie exercises for spine rehabilitation. We also discussed potential benefits of a lumbar epidural steroid injection which the patient agrees with.  Plan: #1. Referral to physical therapy for spine strengthening and rehabilitation. #2. Lumbar epidural steroid injection under fluoroscopy, next available with me.  Ordered Lab-work, Procedure(s), Referral(s), & Consult(s): Orders Placed This Encounter  Procedures  . Lumbar Epidural Injection  . Ambulatory referral to Physical Therapy  . Verify informed consent  . Discharge instructions   Pharmacotherapy (current): Medications ordered:  No orders of the defined types were placed in this encounter.  Medications administered during this visit: Mr. Fortin had no medications administered during this visit.   Pharmacological management options:  Opioid Analgesics: The patient was informed that there is no guarantee that he would be a candidate for opioid analgesics. The decision will be made following CDC guidelines. This decision will be based on the results of diagnostic studies, as well as Mr. Janek risk profile.   Membrane stabilizer: To be determined at a later time  Muscle relaxant: To be determined at a later time  NSAID: To be determined at a later time  Other analgesic(s): To be determined at a later time   Interventional management options: Mr. Rosencrans was informed that there is no guarantee that he would be a candidate for interventional therapies. The decision will be based on the results of diagnostic studies, as well as Mr. Hogeland risk profile.  Procedure(s) under consideration:  -Lumbar epidural steroid  injection  -Lumbar medial branch nerve blocks with a radio frequency ablation -Lumbar trigger point injections  -SCS trial   Provider-requested follow-up: Return for Procedure-lesi.  No future appointments.  Primary Care Physician: Madelyn Brunner, MD Location: [facility] Note by: Gillis Santa, M.D, Date: 10/31/2016; Time: 9:26 AM  Patient Instructions  Today we did the followin: -schedule you for lumbar epidural steroid injection    Thank you for visiting the [facility]:  -Remember to bring all your pain pills to every clinic visit, in their original containers. -Because of the high volume of calls we receive and the high demand for our clinical services, we are occupied all day providing care for patients in the clinic. This leaves little time to respond to phone calls, and we are generally unable to discuss patient care advice over the telephone. If you are experiencing a medication side effect or complication, you can call and let us know, but we will typically not make a medication substitution or change over the telephone. We are generally unable to respond acutely to a flare up of pain, as this is quite common in our patients and needs to be dealt with as part of the long term management plan. Please make an appointment with [place] if you wish to discuss a matter in any detail. Should you still need to call, please do so at . Please do not call for early medication refills.  Thank you  for choosing [facility]. It was a pleasure to see you in clinic today. Please contact us with any questions or concerns at  Gillis Santa, MD   Do not eat or drink 3 hours before your appointment.

## 2016-10-31 NOTE — Patient Instructions (Addendum)
Today we did the followin: -schedule you for lumbar epidural steroid injection    Thank you for visiting the [facility]:  -Remember to bring all your pain pills to every clinic visit, in their original containers. -Because of the high volume of calls we receive and the high demand for our clinical services, we are occupied all day providing care for patients in the clinic. This leaves little time to respond to phone calls, and we are generally unable to discuss patient care advice over the telephone. If you are experiencing a medication side effect or complication, you can call and let us know, but we will typically not make a medication substitution or change over the telephone. We are generally unable to respond acutely to a flare up of pain, as this is quite common in our patients and needs to be dealt with as part of the long term management plan. Please make an appointment with us if you wish to discuss a matter in any detail. Should you still need to call, please do so at . Please do not call for early medication refills.  Thank you for choosing [facility]. It was a pleasure to see you in clinic today. Please contact us with any questions or concerns at  Edward JollyBilal Jaydynn Wolford, MD   Do not eat or drink 3 hours before your appointment.

## 2016-10-31 NOTE — Progress Notes (Signed)
Safety precautions to be maintained throughout the outpatient stay will include: orient to surroundings, keep bed in low position, maintain call bell within reach at all times, provide assistance with transfer out of bed and ambulation.  

## 2016-11-01 DIAGNOSIS — J301 Allergic rhinitis due to pollen: Secondary | ICD-10-CM | POA: Diagnosis not present

## 2016-11-04 DIAGNOSIS — J301 Allergic rhinitis due to pollen: Secondary | ICD-10-CM | POA: Diagnosis not present

## 2016-11-06 ENCOUNTER — Ambulatory Visit
Admission: RE | Admit: 2016-11-06 | Discharge: 2016-11-06 | Disposition: A | Discharge status: Home / Self Care | Payer: 59 | Source: Ambulatory Visit | Attending: Student in an Organized Health Care Education/Training Program | Admitting: Student in an Organized Health Care Education/Training Program

## 2016-11-06 ENCOUNTER — Ambulatory Visit (HOSPITAL_BASED_OUTPATIENT_CLINIC_OR_DEPARTMENT_OTHER): Payer: 59 | Admitting: Student in an Organized Health Care Education/Training Program

## 2016-11-06 ENCOUNTER — Encounter: Payer: Self-pay | Admitting: Student in an Organized Health Care Education/Training Program

## 2016-11-06 VITALS — BP 123/84 | HR 74 | Temp 97.5°F | Resp 17 | Ht 72.0 in | Wt 172.0 lb

## 2016-11-06 DIAGNOSIS — M48061 Spinal stenosis, lumbar region without neurogenic claudication: Secondary | ICD-10-CM | POA: Diagnosis not present

## 2016-11-06 DIAGNOSIS — M79604 Pain in right leg: Secondary | ICD-10-CM | POA: Diagnosis present

## 2016-11-06 DIAGNOSIS — M5116 Intervertebral disc disorders with radiculopathy, lumbar region: Secondary | ICD-10-CM | POA: Insufficient documentation

## 2016-11-06 DIAGNOSIS — M5136 Other intervertebral disc degeneration, lumbar region: Secondary | ICD-10-CM

## 2016-11-06 DIAGNOSIS — M5416 Radiculopathy, lumbar region: Secondary | ICD-10-CM

## 2016-11-06 DIAGNOSIS — Z79899 Other long term (current) drug therapy: Secondary | ICD-10-CM | POA: Insufficient documentation

## 2016-11-06 DIAGNOSIS — M5127 Other intervertebral disc displacement, lumbosacral region: Secondary | ICD-10-CM | POA: Diagnosis not present

## 2016-11-06 DIAGNOSIS — M9983 Other biomechanical lesions of lumbar region: Secondary | ICD-10-CM

## 2016-11-06 MED ORDER — SODIUM CHLORIDE 0.9% FLUSH
2.0000 mL | Freq: Once | INTRAVENOUS | Status: AC
  Administered 2016-11-06: 10 mL

## 2016-11-06 MED ORDER — DEXAMETHASONE SODIUM PHOSPHATE 10 MG/ML IJ SOLN
10.0000 mg | Freq: Once | INTRAMUSCULAR | Status: AC
  Administered 2016-11-06: 10 mg
  Filled 2016-11-06: qty 1

## 2016-11-06 MED ORDER — SODIUM CHLORIDE 0.9 % IJ SOLN
INTRAMUSCULAR | Status: AC
  Filled 2016-11-06: qty 10

## 2016-11-06 MED ORDER — LIDOCAINE HCL (PF) 1 % IJ SOLN
5.0000 mL | Freq: Once | INTRAMUSCULAR | Status: AC
  Administered 2016-11-06: 5 mL
  Filled 2016-11-06: qty 5

## 2016-11-06 MED ORDER — ONDANSETRON HCL 4 MG/2ML IJ SOLN
INTRAMUSCULAR | Status: AC
  Filled 2016-11-06: qty 2

## 2016-11-06 MED ORDER — IOPAMIDOL (ISOVUE-M 200) INJECTION 41%
10.0000 mL | Freq: Once | INTRAMUSCULAR | Status: AC
  Administered 2016-11-06: 10 mL via EPIDURAL
  Filled 2016-11-06: qty 10

## 2016-11-06 MED ORDER — ROPIVACAINE HCL 2 MG/ML IJ SOLN
2.0000 mL | Freq: Once | INTRAMUSCULAR | Status: AC
  Administered 2016-11-06: 10 mL via EPIDURAL
  Filled 2016-11-06: qty 10

## 2016-11-06 MED ORDER — MIDAZOLAM HCL 5 MG/5ML IJ SOLN
INTRAMUSCULAR | Status: AC
  Filled 2016-11-06: qty 5

## 2016-11-06 MED ORDER — ONDANSETRON HCL 4 MG/2ML IJ SOLN
4.0000 mg | Freq: Once | INTRAMUSCULAR | Status: AC | PRN
  Administered 2016-11-06: 4 mg via INTRAVENOUS

## 2016-11-06 MED ORDER — MIDAZOLAM HCL 5 MG/5ML IJ SOLN
1.0000 mg | Freq: Once | INTRAMUSCULAR | Status: AC
  Administered 2016-11-06: 1 mg via INTRAVENOUS

## 2016-11-06 NOTE — Progress Notes (Signed)
Safety precautions to be maintained throughout the outpatient stay will include: orient to surroundings, keep bed in low position, maintain call bell within reach at all times, provide assistance with transfer out of bed and ambulation.  

## 2016-11-06 NOTE — Patient Instructions (Signed)

## 2016-11-06 NOTE — Progress Notes (Signed)
Patient's Name: Gregory BollmanRussell B Steier  MRN: [id number]  Referring Provider: Edward JollyLateef, Ilona Colley, MD  DOB: [birth year]  PCP: Rafael BihariWalker, John B III, MD  DOS: 11/06/2016  Note by: Edward JollyBilal Juanna Pudlo, MD  Service setting: Ambulatory outpatient  Specialty: Interventional Pain Management  Patient type: Established  Location: [facility] ([facility]  Visit type: Interventional Procedure   Primary Reason for Visit: Interventional Pain Management Treatment. CC: Leg Pain (right)  Procedure:  Anesthesia, Analgesia, Anxiolysis:  Type: Therapeutic Inter-Laminar Epidural Steroid Injection Region: Lumbar Level: L5-S1 Level. Laterality: Midline         Type: Local Anesthesia Local Anesthetic: Lidocaine 1% Route: Infiltration ([place]/IM) IV Access: Declined Sedation: Meaningful verbal contact was maintained at all times during the procedure  Indication(s): Analgesia and Anxiety  Indications: 1. Lumbar radiculopathy, chronic   2. Lumbar degenerative disc disease   3. Neuroforaminal stenosis of lumbar spine     46 year old male seen in clinic on 10/31/2016 with worsening low back pain with radiation to right greater than left leg with MRI findings consistent with acute on chronic lumbosacral radiculopathy and chronic lumbar degenerative disc disease. Patient's MRI shows left foraminal and foraminal disc protrusions at L4-L5 and L3-L4. He also has a small central disc extrusion with superior migration of 6 mm at the L5-S1 without any spinal canal or lateral recess stenosis.   Plan for IL ESI today.   Pain Score: Pre-procedure: 7 /10 Post-procedure: 0-No pain/10  Pre-op Assessment:  Mr. Gregory Guzman is a 46 y.o. (year old), male patient, seen today for interventional treatment. He  has a past surgical history that includes Back surgery; Nasal sinus surgery; Lithotripsy (N/A); Cystoscopy/ureteroscopy/holmium laser/stent placement (Right, 03/01/2015); Cystoscopy w/ retrogrades (Left, 03/01/2015); Ureteroscopy with holmium laser  lithotripsy (Right, 03/08/2015); and Cystoscopy with stent placement (Right, 03/08/2015). Mr. Gregory Guzman has a current medication list which includes the following prescription(s): citalopram, epinephrine, losartan, and pravastatin. His primarily concern today is the Leg Pain (right)  Initial Vital Signs: There were no vitals taken for this visit. BMI: Estimated body mass index is 23.33 kg/m as calculated from the following:   Height as of this encounter: 6' (1.829 m).   Weight as of this encounter: 172 lb (78 kg).  Risk Assessment: Allergies: Reviewed. He has No Known Allergies.  Allergy Precautions: None required Coagulopathies: Reviewed. None identified.  Blood-thinner therapy: None at this time Active Infection(s): Reviewed. None identified. Mr. Gregory Guzman is afebrile  Site Confirmation: Mr. Gregory Guzman was asked to confirm the procedure and laterality before marking the site Procedure checklist: Completed Consent: Before the procedure and under the influence of no sedative(s), amnesic(s), or anxiolytics, the patient was informed of the treatment options, risks and possible complications. To fulfill our ethical and legal obligations, as recommended by the American Medical Association's Code of Ethics, I have informed the patient of my clinical impression; the nature and purpose of the treatment or procedure; the risks, benefits, and possible complications of the intervention; the alternatives, including doing nothing; the risk(s) and benefit(s) of the alternative treatment(s) or procedure(s); and the risk(s) and benefit(s) of doing nothing. The patient was provided information about the general risks and possible complications associated with the procedure. These may include, but are not limited to: failure to achieve desired goals, infection, bleeding, organ or nerve damage, allergic reactions, paralysis, and death. In addition, the patient was informed of those risks and complications associated to  Spine-related procedures, such as failure to decrease pain; infection (i.e.: Meningitis, epidural or intraspinal abscess); bleeding (i.e.: epidural hematoma,  subarachnoid hemorrhage, or any other type of intraspinal or peri-dural bleeding); organ or nerve damage (i.e.: Any type of peripheral nerve, nerve root, or spinal cord injury) with subsequent damage to sensory, motor, and/or autonomic systems, resulting in permanent pain, numbness, and/or weakness of one or several areas of the body; allergic reactions; (i.e.: anaphylactic reaction); and/or death. Furthermore, the patient was informed of those risks and complications associated with the medications. These include, but are not limited to: allergic reactions (i.e.: anaphylactic or anaphylactoid reaction(s)); adrenal axis suppression; blood sugar elevation that in diabetics may result in ketoacidosis or comma; water retention that in patients with history of congestive heart failure may result in shortness of breath, pulmonary edema, and decompensation with resultant heart failure; weight gain; swelling or edema; medication-induced neural toxicity; particulate matter embolism and blood vessel occlusion with resultant organ, and/or nervous system infarction; and/or aseptic necrosis of one or more joints. Finally, the patient was informed that Medicine is not an exact science; therefore, there is also the possibility of unforeseen or unpredictable risks and/or possible complications that may result in a catastrophic outcome. The patient indicated having understood very clearly. We have given the patient no guarantees and we have made no promises. Enough time was given to the patient to ask questions, all of which were answered to the patient's satisfaction. Mr. Sjogren has indicated that he wanted to continue with the procedure. Attestation: I, the ordering provider, attest that I have discussed with the patient the benefits, risks, side-effects, alternatives,  likelihood of achieving goals, and potential problems during recovery for the procedure that I have provided informed consent. Date: 11/06/2016; Time: 7:53 AM  Pre-Procedure Preparation:  Monitoring: As per clinic protocol. Respiration, ETCO2, SpO2, BP, heart rate and rhythm monitor placed and checked for adequate function Safety Precautions: Patient was assessed for positional comfort and pressure points before starting the procedure. Time-out: I initiated and conducted the "Time-out" before starting the procedure, as per protocol. The patient was asked to participate by confirming the accuracy of the "Time Out" information. Verification of the correct person, site, and procedure were performed and confirmed by me, the nursing staff, and the patient. "Time-out" conducted as per Joint Commission's Universal Protocol (UP.01.01.01). "Time-out" Date & Time: 11/06/2016; 0844 hrs.  Description of Procedure Process:   Position: Prone with head of the table was raised to facilitate breathing. Target Area: The interlaminar space, initially targeting the lower laminar border of the superior vertebral body. Approach: Paramedial approach. Area Prepped: Entire Posterior Lumbar Region Prepping solution: ChloraPrep (2% chlorhexidine gluconate and 70% isopropyl alcohol) Safety Precautions: Aspiration looking for blood return was conducted prior to all injections. At no point did we inject any substances, as a needle was being advanced. No attempts were made at seeking any paresthesias. Safe injection practices and needle disposal techniques used. Medications properly checked for expiration dates. SDV (single dose vial) medications used. Description of the Procedure: Protocol guidelines were followed. The procedure needle was introduced through the skin, ipsilateral to the reported pain, and advanced to the target area. Bone was contacted and the needle walked caudad, until the lamina was cleared. The epidural space  was identified using "loss-of-resistance technique" with 2-3 ml of PF-NaCl (0.9% NSS), in a 5cc LOR glass syringe.  Of note, after topical is a patient of skin, patient started to feel nauseous and flushed. No medications are given at this time. Patient stated that it was an anxiety attack. Procedure was positive, IV was inserted and patient was given IV  fluids, 4 mg of Zofran, 1 mg of Versed for his periprocedural anxiety and nausea. Patient started to feel better after 3-5 minutes, procedure resumed without any issues or complications.  Vitals:   11/06/16 0903 11/06/16 0907 11/06/16 0917 11/06/16 0927  BP: (!) 136/105 (!) 147/91 127/90 122/85  Pulse: 79 81 82 75  Resp: 19 18 17 16   Temp:      SpO2: 100% 99% 96% 97%  Weight:      Height:        Start Time: 0845 hrs. End Time: 0907 hrs. Materials:  Needle(s) Type: Epidural needle Gauge: 20G Length: 3.5-in Medication(s): We administered iopamidol, sodium chloride flush, lidocaine (PF), ropivacaine (PF) 2 mg/mL (0.2%), dexamethasone, ondansetron, and midazolam. Please see chart orders for dosing details. 10 mg of Decadron, 2 cc of 0.2% ropivacaine, 6 cc of preservative-free saline= 9 cc total Imaging Guidance (Spinal):  Type of Imaging Technique: Fluoroscopy Guidance (Spinal) Indication(s): Assistance in needle guidance and placement for procedures requiring needle placement in or near specific anatomical locations not easily accessible without such assistance. Exposure Time: Please see nurses notes. Contrast: Before injecting any contrast, we confirmed that the patient did not have an allergy to iodine, shellfish, or radiological contrast. Once satisfactory needle placement was completed at the desired level, radiological contrast was injected. Contrast injected under live fluoroscopy. No contrast complications. See chart for type and volume of contrast used. Fluoroscopic Guidance: I was personally present during the use of fluoroscopy.  "Tunnel Vision Technique" used to obtain the best possible view of the target area. Parallax error corrected before commencing the procedure. "Direction-depth-direction" technique used to introduce the needle under continuous pulsed fluoroscopy. Once target was reached, antero-posterior, oblique, and lateral fluoroscopic projection used confirm needle placement in all planes. Images permanently stored in EMR. Interpretation: I personally interpreted the imaging intraoperatively. Adequate needle placement confirmed in multiple planes. Appropriate spread of contrast into desired area was observed. No evidence of afferent or efferent intravascular uptake. No intrathecal or subarachnoid spread observed. Permanent images saved into the patient's record.  Antibiotic Prophylaxis:  Indication(s): None identified Antibiotic given: None  Post-operative Assessment:  EBL: None Complications: No immediate post-treatment complications observed by team, or reported by patient. Note: The patient tolerated the entire procedure well. A repeat set of vitals were taken after the procedure and the patient was kept under observation following institutional policy, for this type of procedure. Post-procedural neurological assessment was performed, showing return to baseline, prior to discharge. The patient was provided with post-procedure discharge instructions, including a section on how to identify potential problems. Should any problems arise concerning this procedure, the patient was given instructions to immediately contact us, at any time, without hesitation. In any case, we plan to contact the patient by telephone for a follow-up status report regarding this interventional procedure. Comments:  No additional relevant information.   5 out of 5 strength bilateral lower extremity to plantar flexion dorsiflexion and knee flexion at discharge. Sensation intact bilateral lower extremities at discharge.  Plan of Care   Disposition: Discharge home  Discharge Date & Time: 11/06/2016;0935  Physician-requested Follow-up:  Return in about 2 weeks (around 11/20/2016) for Post Procedure Evaluation.  New Prescriptions   No medications on file   Future Appointments Date Time Provider Department Center  11/21/2016 8:30 AM Edward Jolly, MD [facility] None    Imaging Orders     DG C-Arm 1-60 Min-No Report Procedure Orders    No procedure(s) ordered today    Medications ordered  for procedure: Meds ordered this encounter  Medications  . iopamidol (ISOVUE-M) 41 % intrathecal injection 10 mL  . sodium chloride flush (NS) 0.9 % injection 2 mL  . lidocaine (PF) (XYLOCAINE) 1 % injection 5 mL  . ropivacaine (PF) 2 mg/mL (0.2%) (NAROPIN) injection 2 mL  . dexamethasone (DECADRON) injection 10 mg  . ondansetron (ZOFRAN) injection 4 mg  . midazolam (VERSED) 5 MG/5ML injection 1 mg   Medications administered: We administered iopamidol, sodium chloride flush, lidocaine (PF), ropivacaine (PF) 2 mg/mL (0.2%), dexamethasone, ondansetron, and midazolam.  See the medical record for exact dosing, route, and time of administration.  Primary Care Physician: [name], MD Location: [facility] Note by: Edward Jolly, MD Date: 11/06/2016; Time: 9:36 AM  Disclaimer:  Medicine is not an exact science. The only guarantee in medicine is that nothing is guaranteed. It is important to note that the decision to proceed with this intervention was based on the information collected from the patient. The Data and conclusions were drawn from the patient's questionnaire, the interview, and the physical examination. Because the information was provided in large part by the patient, it cannot be guaranteed that it has not been purposely or unconsciously manipulated. Every effort has been made to obtain as much relevant data as possible for this evaluation. It is important to note that the conclusions  that lead to this procedure are derived in large part from the available data. Always take into account that the treatment will also be dependent on availability of resources and existing treatment guidelines, considered by other Pain Management Practitioners as being common knowledge and practice, at the time of the intervention. For Medico-Legal purposes, it is also important to point out that variation in procedural techniques and pharmacological choices are the acceptable norm. The indications, contraindications, technique, and results of the above procedure should only be interpreted and judged by a Board-Certified Interventional Pain Specialist with extensive familiarity and expertise in the same exact procedure and technique.

## 2016-11-06 NOTE — Progress Notes (Signed)
[id number] Patient began to feel nauseated. Dr. Cherylann RatelLateef in room with patient. VS monitered. ESI procedure stopped. Moderated sedation offered by Dr. Cherylann RatelLateef. Verified that patient has a driver with him and is NPO.  0905 After Zofran and Versed, states he is feeling better. ESI done.  [id number] Dr Cherylann RatelLateef in to eval pt before discharge

## 2016-11-07 ENCOUNTER — Telehealth: Payer: Self-pay | Admitting: *Deleted

## 2016-11-07 NOTE — Telephone Encounter (Signed)
Denies problems post procedure. 

## 2016-11-21 ENCOUNTER — Encounter: Payer: Self-pay | Admitting: Student in an Organized Health Care Education/Training Program

## 2016-11-21 ENCOUNTER — Ambulatory Visit
Payer: 59 | Attending: Student in an Organized Health Care Education/Training Program | Admitting: Student in an Organized Health Care Education/Training Program

## 2016-11-21 VITALS — BP 132/92 | HR 75 | Temp 97.7°F | Resp 16 | Ht 72.0 in | Wt 170.0 lb

## 2016-11-21 DIAGNOSIS — M5416 Radiculopathy, lumbar region: Secondary | ICD-10-CM

## 2016-11-21 DIAGNOSIS — M51369 Other intervertebral disc degeneration, lumbar region without mention of lumbar back pain or lower extremity pain: Secondary | ICD-10-CM

## 2016-11-21 DIAGNOSIS — Z87442 Personal history of urinary calculi: Secondary | ICD-10-CM | POA: Insufficient documentation

## 2016-11-21 DIAGNOSIS — M545 Low back pain: Secondary | ICD-10-CM | POA: Diagnosis present

## 2016-11-21 DIAGNOSIS — M48061 Spinal stenosis, lumbar region without neurogenic claudication: Secondary | ICD-10-CM

## 2016-11-21 DIAGNOSIS — G8929 Other chronic pain: Secondary | ICD-10-CM

## 2016-11-21 DIAGNOSIS — M5116 Intervertebral disc disorders with radiculopathy, lumbar region: Secondary | ICD-10-CM | POA: Diagnosis not present

## 2016-11-21 DIAGNOSIS — F329 Major depressive disorder, single episode, unspecified: Secondary | ICD-10-CM | POA: Diagnosis not present

## 2016-11-21 DIAGNOSIS — M9983 Other biomechanical lesions of lumbar region: Secondary | ICD-10-CM | POA: Diagnosis not present

## 2016-11-21 DIAGNOSIS — M5136 Other intervertebral disc degeneration, lumbar region: Secondary | ICD-10-CM | POA: Diagnosis not present

## 2016-11-21 DIAGNOSIS — G4730 Sleep apnea, unspecified: Secondary | ICD-10-CM | POA: Diagnosis not present

## 2016-11-21 DIAGNOSIS — N189 Chronic kidney disease, unspecified: Secondary | ICD-10-CM | POA: Insufficient documentation

## 2016-11-21 DIAGNOSIS — E785 Hyperlipidemia, unspecified: Secondary | ICD-10-CM | POA: Diagnosis not present

## 2016-11-21 NOTE — Progress Notes (Signed)
Patient's Name: Gregory Guzman  MRN: [id number]  Referring Provider: Madelyn Brunner, MD  DOB: November 02, 1970  PCP: Madelyn Brunner, MD  DOS: 11/21/2016  Note by: Gillis Santa, MD  Service setting: Ambulatory outpatient  Specialty: Interventional Pain Management  Location: [facility] (AMB) Pain Management Facility    Patient type: Established   Primary Reason(s) for Visit: Encounter for post-procedure evaluation of chronic illness with mild to moderate exacerbation CC: Back Pain (lower right)  HPI  Gregory Guzman is a 46 y.o. year old, male patient, who comes today for a post-procedure evaluation. He has Clinical depression; HLD (hyperlipidemia); and Apnea, sleep on his problem list. His primarily concern today is the Back Pain (lower right)  Pain Assessment: Location: Lower, Right Back Radiating: down the right leg Onset: More than a month ago Duration: Chronic pain Quality: Sharp, Constant (pain is deeper now and always there when he moves.  not bothered by sitting. ) Severity: 4 /10 (self-reported pain score)  Note: Reported level is compatible with observation.                   Effect on ADL: pain is very much a distraction  Timing: Intermittent Modifying factors: proper body mechanics  Gregory Guzman comes in today for post-procedure evaluation after the treatment done on 11/06/2016.  Further details on both, my assessment(s), as well as the proposed treatment plan, please see below.  Post-Procedure Assessment  11/06/2016 Procedure: L5-S1 ESI on 11/06/2016 Pre-procedure pain score:  7/10 Post-procedure pain score: 0/10         Influential Factors: BMI: 23.06 kg/m Intra-procedural challenges: None observed.         Assessment challenges: None detected.              Reported side-effects: None.        Post-procedural adverse reactions or complications: None reported         Sedation: Please see nurses note. When no sedatives are used, the analgesic levels obtained are directly associated to  the effectiveness of the local anesthetics. However, when sedation is provided, the level of analgesia obtained during the initial 1 hour following the intervention, is believed to be the result of a combination of factors. These factors may include, but are not limited to: 1. The effectiveness of the local anesthetics used. 2. The effects of the analgesic(s) and/or anxiolytic(s) used. 3. The degree of discomfort experienced by the patient at the time of the procedure. 4. The patients ability and reliability in recalling and recording the events. 5. The presence and influence of possible secondary gains and/or psychosocial factors. Reported result: Relief experienced during the 1st hour after the procedure: 95 % (Ultra-Short Term Relief)            Interpretative annotation: Clinically appropriate result. Analgesia during this period is likely to be Local Anesthetic and/or IV Sedative (Analgesic/Anxiolytic) related.          Effects of local anesthetic: The analgesic effects attained during this period are directly associated to the localized infiltration of local anesthetics and therefore cary significant diagnostic value as to the etiological location, or anatomical origin, of the pain. Expected duration of relief is directly dependent on the pharmacodynamics of the local anesthetic used. Long-acting (4-6 hours) anesthetics used.  Reported result: Relief during the next 4 to 6 hour after the procedure: 95 % (Short-Term Relief)            Interpretative annotation: Clinically appropriate result. Analgesia during  this period is likely to be Local Anesthetic-related.          Long-term benefit: Defined as the period of time past the expected duration of local anesthetics (1 hour for short-acting and 4-6 hours for long-acting). With the possible exception of prolonged sympathetic blockade from the local anesthetics, benefits during this period are typically attributed to, or associated with, other factors  such as analgesic sensory neuropraxia, antiinflammatory effects, or beneficial biochemical changes provided by agents other than the local anesthetics.  Reported result: Extended relief following procedure: 10 % (pain is different than before but is still there especially with activity.) (Long-Term Relief)            Interpretative annotation: Clinically appropriate result. Good relief.  Inflammation plays a part in the etiology to the pain.          Current benefits: Defined as persistent relief that continues at this point in time.   Reported results: Treated area: 25 %      patient also states that his pain is not as constant as before and that he will have intermittent shooting pains into his right leg which is better than before Interpretative annotation: Recurrence of symptoms.  Effective diagnostic intervention.          Interpretation: Results would suggest a successful diagnostic intervention.                  Plan:  Please see "Plan of Care" for details.  Laboratory Chemistry  Inflammation Markers (CRP: Acute Phase) (ESR: Chronic Phase) No results found for: CRP, ESRSEDRATE               Renal Function Markers Lab Results  Component Value Date   BUN 12 02/23/2015   CREATININE 1.11 02/23/2015   GFRAA >60 02/23/2015   GFRNONAA >60 02/23/2015                 Hepatic Function Markers No results found for: AST, ALT, ALBUMIN, ALKPHOS, HCVAB               Electrolytes Lab Results  Component Value Date   NA 140 02/23/2015   K 4.7 02/23/2015   CL 107 02/23/2015   CALCIUM 8.8 (L) 02/23/2015                 Neuropathy Markers No results found for: [id number]               Bone Pathology Markers Lab Results  Component Value Date   CALCIUM 8.8 (L) 02/23/2015                 Coagulation Parameters Lab Results  Component Value Date   PLT 248 02/23/2015                 Cardiovascular Markers Lab Results  Component Value Date   HGB 15.3 02/23/2015   HCT 45.6  02/23/2015                 Note: Lab results reviewed.  Recent Diagnostic Imaging Review  Dg C-arm 1-60 Min-no Report  Result Date: 11/06/2016 Fluoroscopy was utilized by the requesting physician.  No radiographic interpretation.   Note: Imaging results reviewed.          Meds   Current Outpatient Prescriptions:  .  cetirizine (ZYRTEC) 10 MG tablet, Take 10 mg by mouth daily., Disp: , Rfl:  .  citalopram (CELEXA) 20 MG tablet, Take 40 mg by mouth every morning. ,  Disp: , Rfl:  .  EPINEPHrine (EPIPEN 2-PAK) 0.3 mg/0.3 mL IJ SOAJ injection, Reported on 04/14/2015, Disp: , Rfl:  .  losartan (COZAAR) 25 MG tablet, Take 25 mg by mouth daily., Disp: , Rfl:  .  pravastatin (PRAVACHOL) 20 MG tablet, TK 1 T PO NIGHTLY, Disp: , Rfl: 5  ROS  Constitutional: Denies any fever or chills Gastrointestinal: No reported hemesis, hematochezia, vomiting, or acute GI distress Musculoskeletal: Denies any acute onset joint swelling, redness, loss of ROM, or weakness Neurological: No reported episodes of acute onset apraxia, aphasia, dysarthria, agnosia, amnesia, paralysis, loss of coordination, or loss of consciousness  Allergies  Gregory Guzman has No Known Allergies.  PFSH  Drug: Gregory Guzman  reports that he does not use drugs. Alcohol:  reports that he drinks alcohol. Tobacco:  reports that he has never smoked. He has never used smokeless tobacco. Medical:  has a past medical history of Allergy; Chicken pox; Chronic kidney disease; Depression; Difficult intubation; Hematuria; HLD (hyperlipidemia); Kidney stone; Rosacea; and Sleep apnea. Surgical: Gregory Guzman  has a past surgical history that includes Back surgery; Nasal sinus surgery; Lithotripsy (N/A); Cystoscopy/ureteroscopy/holmium laser/stent placement (Right, 03/01/2015); Cystoscopy w/ retrogrades (Left, 03/01/2015); Ureteroscopy with holmium laser lithotripsy (Right, 03/08/2015); and Cystoscopy with stent placement (Right, 03/08/2015). Family: family history  includes Heart disease in his father and paternal grandmother; Kidney Stones in his maternal grandmother and mother; Thyroid disease in his sister.  Constitutional Exam  General appearance: Well nourished, well developed, and well hydrated. In no apparent acute distress Vitals:   11/21/16 0845  BP: (!) 132/92  Pulse: 75  Resp: 16  Temp: 97.7 F (36.5 C)  TempSrc: Oral  SpO2: 99%  Weight: 170 lb (77.1 kg)  Height: 6' (1.829 m)   BMI Assessment: Estimated body mass index is 23.06 kg/m as calculated from the following:   Height as of this encounter: 6' (1.829 m).   Weight as of this encounter: 170 lb (77.1 kg).  BMI interpretation table: BMI level Category Range association with higher incidence of chronic pain  <18 kg/m2 Underweight   18.5-24.9 kg/m2 Ideal body weight   25-29.9 kg/m2 Overweight Increased incidence by 20%  30-34.9 kg/m2 Obese (Class I) Increased incidence by 68%  35-39.9 kg/m2 Severe obesity (Class II) Increased incidence by 136%  >40 kg/m2 Extreme obesity (Class III) Increased incidence by 254%   BMI Readings from Last 4 Encounters:  11/21/16 23.06 kg/m  11/06/16 23.33 kg/m  10/31/16 23.33 kg/m  04/14/15 22.81 kg/m   Wt Readings from Last 4 Encounters:  11/21/16 170 lb (77.1 kg)  11/06/16 172 lb (78 kg)  10/31/16 172 lb (78 kg)  04/14/15 168 lb 3.2 oz (76.3 kg)  Psych/Mental status: Alert, oriented x 3 (person, place, & time)       Eyes: PERLA Respiratory: No evidence of acute respiratory distress   Cervical Spine Area Exam  Skin & Axial Inspection: No masses, redness, edema, swelling, or associated skin lesions Alignment: Symmetrical Functional ROM: Unrestricted ROM      Stability: No instability detected Muscle Tone/Strength: Functionally intact. No obvious neuro-muscular anomalies detected. Sensory (Neurological): Unimpaired Palpation: No palpable anomalies                     Upper Extremity (UE) Exam    Side: Right upper extremity   Side: Left upper extremity   Skin & Extremity Inspection: Skin color, temperature, and hair growth are WNL. No peripheral edema or cyanosis. No masses, redness, swelling,  asymmetry, or associated skin lesions. No contractures.  Skin & Extremity Inspection: Skin color, temperature, and hair growth are WNL. No peripheral edema or cyanosis. No masses, redness, swelling, asymmetry, or associated skin lesions. No contractures.   Functional ROM: Unrestricted ROM          Functional ROM: Unrestricted ROM           Muscle Tone/Strength: Functionally intact. No obvious neuro-muscular anomalies detected.  Muscle Tone/Strength: Functionally intact. No obvious neuro-muscular anomalies detected.   Sensory (Neurological): Unimpaired  Sensory (Neurological): Unimpaired   Palpation: No palpable anomalies              Palpation: No palpable anomalies               Specialized Test(s): Deferred         Specialized Test(s): Deferred           Thoracic Spine Area Exam  Skin & Axial Inspection: No masses, redness, or swelling Alignment: Symmetrical Functional ROM: Unrestricted ROM Stability: No instability detected Muscle Tone/Strength: Functionally intact. No obvious neuro-muscular anomalies detected. Sensory (Neurological): Unimpaired Muscle strength & Tone: No palpable anomalies  Lumbar Spine Area Exam  Skin & Axial Inspection: No masses, redness, or swelling Alignment: Symmetrical Functional ROM: Unrestricted ROM      Stability: No instability detected Muscle Tone/Strength: Functionally intact. No obvious neuro-muscular anomalies detected. Sensory (Neurological): Unimpaired Palpation: No palpable anomalies       Provocative Tests: Lumbar Hyperextension and rotation test: Positive       Lumbar Lateral bending test: Positive       Patrick's Maneuver: evaluation deferred today                   Positive straight leg raise test, right greater than left Gait & Posture Assessment  Ambulation:  Unassisted Gait: Relatively normal for age and body habitus Posture: WNL   Lower Extremity Exam    Side: Right lower extremity  Side: Left lower extremity  Skin & Extremity Inspection: Skin color, temperature, and hair growth are WNL. No peripheral edema or cyanosis. No masses, redness, swelling, asymmetry, or associated skin lesions. No contractures.  Skin & Extremity Inspection: Skin color, temperature, and hair growth are WNL. No peripheral edema or cyanosis. No masses, redness, swelling, asymmetry, or associated skin lesions. No contractures.  Functional ROM: Unrestricted ROM          Functional ROM: Unrestricted ROM          Muscle Tone/Strength: Functionally intact. No obvious neuro-muscular anomalies detected.  Muscle Tone/Strength: Functionally intact. No obvious neuro-muscular anomalies detected.  Sensory (Neurological): Unimpaired  Sensory (Neurological): Unimpaired  Palpation: No palpable anomalies  Palpation: No palpable anomalies     Assessment  Primary Diagnosis & Pertinent Problem List: The primary encounter diagnosis was Lumbar radiculopathy. Diagnoses of Lumbar degenerative disc disease, Neuroforaminal stenosis of lumbar spine, and Lumbosacral pain, chronic were also pertinent to this visit.  Status Diagnosis  Improving Stable Stable 1. Lumbar radiculopathy   2. Lumbar degenerative disc disease   3. Neuroforaminal stenosis of lumbar spine   4. Lumbosacral pain, chronic      Plan of Care   Patient is a 46 year old male with a past medical history of lumbar microdiscectomy at L4-L5 approximately 27 years ago secondary to a herniated disc from a lifting accident.   MRI findings and patient's clinical history and exam are consistent with an acute on chronic lumbosacral radiculopathy, right greater than left, affecting L4-L5 and L5-S1. Patient's  MRI shows left foraminal and foraminal disc protrusions at L4-L5 and L3-L4. He also has a small central disc extrusion  with superior migration of 6 mm at the L5-S1 without any spinal canal or lateral recess stenosis. Patient does not have any central lumbar spinal canal stenosis.  Patient is status post lumbar epidural steroid injection on August 15 with moderate relief of his radicular pain symptoms. He feels like it is sharp shooting pain down into his right leg is not as constant and severe as before. It is more intermittent in frequency. He is interested in repeating another epidural steroid injection which I will schedule him for.  Plan: -Repeat lumbar epidural steroid injection #2  Lab-work, procedure(s), and/or referral(s): Orders Placed This Encounter  Procedures  . Lumbar Epidural Injection     Provider-requested follow-up: Return for Procedure.  No future appointments.  Primary Care Physician: Madelyn Brunner, MD Location: [facility] Note by: Gillis Santa, M.D Date: 11/21/2016; Time: 9:43 AM  Patient Instructions   Repeat lumbar ESI  GENERAL RISKS AND COMPLICATIONS  What are the risk, side effects and possible complications? Generally speaking, most procedures are safe.  However, with any procedure there are risks, side effects, and the possibility of complications.  The risks and complications are dependent upon the sites that are lesioned, or the type of nerve block to be performed.  The closer the procedure is to the spine, the more serious the risks are.  Great care is taken when placing the radio frequency needles, block needles or lesioning probes, but sometimes complications can occur. 1. Infection: Any time there is an injection through the skin, there is a risk of infection.  This is why sterile conditions are used for these blocks.  There are four possible types of infection. 1. Localized skin infection. 2. Central Nervous System Infection-This can be in the form of Meningitis, which can be deadly. 3. Epidural Infections-This can be in the form of  an epidural abscess, which can cause pressure inside of the spine, causing compression of the spinal cord with subsequent paralysis. This would require an emergency surgery to decompress, and there are no guarantees that the patient would recover from the paralysis. 4. Discitis-This is an infection of the intervertebral discs.  It occurs in about 1% of discography procedures.  It is difficult to treat and it may lead to surgery.        2. Pain: the needles have to go through skin and soft tissues, will cause soreness.       3. Damage to internal structures:  The nerves to be lesioned may be near blood vessels or    other nerves which can be potentially damaged.       4. Bleeding: Bleeding is more common if the patient is taking blood thinners such as  aspirin, Coumadin, Ticiid, Plavix, etc., or if he/she have some genetic predisposition  such as hemophilia. Bleeding into the spinal canal can cause compression of the spinal  cord with subsequent paralysis.  This would require an emergency surgery to  decompress and there are no guarantees that the patient would recover from the  paralysis.       5. Pneumothorax:  Puncturing of a lung is a possibility, every time a needle is introduced in  the area of the chest or upper back.  Pneumothorax refers to free air around the  collapsed lung(s), inside of the thoracic cavity (chest cavity).  Another two possible  complications  related to a similar event would include: Hemothorax and Chylothorax.   These are variations of the Pneumothorax, where instead of air around the collapsed  lung(s), you may have blood or chyle, respectively.       6. Spinal headaches: They may occur with any procedures in the area of the spine.       7. Persistent CSF (Cerebro-Spinal Fluid) leakage: This is a rare problem, but may occur  with prolonged intrathecal or epidural catheters either due to the formation of a fistulous  track or a dural tear.       8. Nerve damage: By working so  close to the spinal cord, there is always a possibility of  nerve damage, which could be as serious as a permanent spinal cord injury with  paralysis.       9. Death:  Although rare, severe deadly allergic reactions known as "Anaphylactic  reaction" can occur to any of the medications used.      10. Worsening of the symptoms:  We can always make thing worse.  What are the chances of something like this happening? Chances of any of this occuring are extremely low.  By statistics, you have more of a chance of getting killed in a motor vehicle accident: while driving to the hospital than any of the above occurring .  Nevertheless, you should be aware that they are possibilities.  In general, it is similar to taking a shower.  Everybody knows that you can slip, hit your head and get killed.  Does that mean that you should not shower again?  Nevertheless always keep in mind that statistics do not mean anything if you happen to be on the wrong side of them.  Even if a procedure has a 1 (one) in a 1,000,000 (million) chance of going wrong, it you happen to be that one..Also, keep in mind that by statistics, you have more of a chance of having something go wrong when taking medications.  Who should not have this procedure? If you are on a blood thinning medication (e.g. Coumadin, Plavix, see list of "Blood Thinners"), or if you have an active infection going on, you should not have the procedure.  If you are taking any blood thinners, please inform your physician.  How should I prepare for this procedure?  Do not eat or drink anything at least six hours prior to the procedure.  Bring a driver with you .  It cannot be a taxi.  Come accompanied by an adult that can drive you back, and that is strong enough to help you if your legs get weak or numb from the local anesthetic.  Take all of your medicines the morning of the procedure with just enough water to swallow them.  If you have diabetes, make sure that  you are scheduled to have your procedure done first thing in the morning, whenever possible.  If you have diabetes, take only half of your insulin dose and notify our nurse that you have done so as soon as you arrive at the clinic.  If you are diabetic, but only take blood sugar pills (oral hypoglycemic), then do not take them on the morning of your procedure.  You may take them after you have had the procedure.  Do not take aspirin or any aspirin-containing medications, at least eleven (11) days prior to the procedure.  They may prolong bleeding.  Wear loose fitting clothing that may be easy to take off and that you  would not mind if it got stained with Betadine or blood.  Do not wear any jewelry or perfume  Remove any nail coloring.  It will interfere with some of our monitoring equipment.  NOTE: Remember that this is not meant to be interpreted as a complete list of all possible complications.  Unforeseen problems may occur.  BLOOD THINNERS The following drugs contain aspirin or other products, which can cause increased bleeding during surgery and should not be taken for 2 weeks prior to and 1 week after surgery.  If you should need take something for relief of minor pain, you may take acetaminophen which is found in Tylenol,m Datril, Anacin-3 and Panadol. It is not blood thinner. The products listed below are.  Do not take any of the products listed below in addition to any listed on your instruction sheet.  A.P.C or A.P.C with Codeine Codeine Phosphate Capsules #3 Ibuprofen Ridaura  ABC compound Congesprin Imuran rimadil  Advil Cope Indocin Robaxisal  Alka-Seltzer Effervescent Pain Reliever and Antacid Coricidin or Coricidin-D  Indomethacin Rufen  Alka-Seltzer plus Cold Medicine Cosprin Ketoprofen S-A-C Tablets  Anacin Analgesic Tablets or Capsules Coumadin Korlgesic Salflex  Anacin Extra Strength Analgesic tablets or capsules CP-2 Tablets Lanoril Salicylate  Anaprox Cuprimine  Capsules Levenox Salocol  Anexsia-D Dalteparin Magan Salsalate  Anodynos Darvon compound Magnesium Salicylate Sine-off  Ansaid Dasin Capsules Magsal Sodium Salicylate  Anturane Depen Capsules Marnal Soma  APF Arthritis pain formula Dewitt's Pills Measurin Stanback  Argesic Dia-Gesic Meclofenamic Sulfinpyrazone  Arthritis Bayer Timed Release Aspirin Diclofenac Meclomen Sulindac  Arthritis pain formula Anacin Dicumarol Medipren Supac  Analgesic (Safety coated) Arthralgen Diffunasal Mefanamic Suprofen  Arthritis Strength Bufferin Dihydrocodeine Mepro Compound Suprol  Arthropan liquid Dopirydamole Methcarbomol with Aspirin Synalgos  ASA tablets/Enseals Disalcid Micrainin Tagament  Ascriptin Doan's Midol Talwin  Ascriptin A/D Dolene Mobidin Tanderil  Ascriptin Extra Strength Dolobid Moblgesic Ticlid  Ascriptin with Codeine Doloprin or Doloprin with Codeine Momentum Tolectin  Asperbuf Duoprin Mono-gesic Trendar  Aspergum Duradyne Motrin or Motrin IB Triminicin  Aspirin plain, buffered or enteric coated Durasal Myochrisine Trigesic  Aspirin Suppositories Easprin Nalfon Trillsate  Aspirin with Codeine Ecotrin Regular or Extra Strength Naprosyn Uracel  Atromid-S Efficin Naproxen Ursinus  Auranofin Capsules Elmiron Neocylate Vanquish  Axotal Emagrin Norgesic Verin  Azathioprine Empirin or Empirin with Codeine Normiflo Vitamin E  Azolid Emprazil Nuprin Voltaren  Bayer Aspirin plain, buffered or children's or timed BC Tablets or powders Encaprin Orgaran Warfarin Sodium  Buff-a-Comp Enoxaparin Orudis Zorpin  Buff-a-Comp with Codeine Equegesic Os-Cal-Gesic   Buffaprin Excedrin plain, buffered or Extra Strength Oxalid   Bufferin Arthritis Strength Feldene Oxphenbutazone   Bufferin plain or Extra Strength Feldene Capsules Oxycodone with Aspirin   Bufferin with Codeine Fenoprofen Fenoprofen Pabalate or Pabalate-SF   Buffets II Flogesic Panagesic   Buffinol plain or Extra Strength Florinal or  Florinal with Codeine Panwarfarin   Buf-Tabs Flurbiprofen Penicillamine   Butalbital Compound Four-way cold tablets Penicillin   Butazolidin Fragmin Pepto-Bismol   Carbenicillin Geminisyn Percodan   Carna Arthritis Reliever Geopen Persantine   Carprofen Gold's salt Persistin   Chloramphenicol Goody's Phenylbutazone   Chloromycetin Haltrain Piroxlcam   Clmetidine heparin Plaquenil   Cllnoril Hyco-pap Ponstel   Clofibrate Hydroxy chloroquine Propoxyphen         Before stopping any of these medications, be sure to consult the physician who ordered them.  Some, such as Coumadin (Warfarin) are ordered to prevent or treat serious conditions such as "deep thrombosis", "pumonary embolisms", and other heart problems.  The amount of time that you may need off of the medication may also vary with the medication and the reason for which you were taking it.  If you are taking any of these medications, please make sure you notify your pain physician before you undergo any procedures.         Epidural Steroid Injection Patient Information  Description: The epidural space surrounds the nerves as they exit the spinal cord.  In some patients, the nerves can be compressed and inflamed by a bulging disc or a tight spinal canal (spinal stenosis).  By injecting steroids into the epidural space, we can bring irritated nerves into direct contact with a potentially helpful medication.  These steroids act directly on the irritated nerves and can reduce swelling and inflammation which often leads to decreased pain.  Epidural steroids may be injected anywhere along the spine and from the neck to the low back depending upon the location of your pain.   After numbing the skin with local anesthetic (like Novocaine), a small needle is passed into the epidural space slowly.  You may experience a sensation of pressure while this is being done.  The entire block usually last less than 10 minutes.  Conditions which may be  treated by epidural steroids:   Low back and leg pain  Neck and arm pain  Spinal stenosis  Post-laminectomy syndrome  Herpes zoster (shingles) pain  Pain from compression fractures  Preparation for the injection:  1. Do not eat any solid food or dairy products within 8 hours of your appointment.  2. You may drink clear liquids up to 3 hours before appointment.  Clear liquids include water, black coffee, juice or soda.  No milk or cream please. 3. You may take your regular medication, including pain medications, with a sip of water before your appointment  Diabetics should hold regular insulin (if taken separately) and take 1/2 normal NPH dos the morning of the procedure.  Carry some sugar containing items with you to your appointment. 4. A driver must accompany you and be prepared to drive you home after your procedure.  5. Bring all your current medications with your. 6. An IV may be inserted and sedation may be given at the discretion of the physician.   7. A blood pressure cuff, EKG and other monitors will often be applied during the procedure.  Some patients may need to have extra oxygen administered for a short period. 8. You will be asked to provide medical information, including your allergies, prior to the procedure.  We must know immediately if you are taking blood thinners (like Coumadin/Warfarin)  Or if you are allergic to IV iodine contrast (dye). We must know if you could possible be pregnant.  Possible side-effects:  Bleeding from needle site  Infection (rare, may require surgery)  Nerve injury (rare)  Numbness & tingling (temporary)  Difficulty urinating (rare, temporary)  Spinal headache ( a headache worse with upright posture)  Light -headedness (temporary)  Pain at injection site (several days)  Decreased blood pressure (temporary)  Weakness in arm/leg (temporary)  Pressure sensation in back/neck (temporary)  Call if you experience:  Fever/chills  associated with headache or increased back/neck pain.  Headache worsened by an upright position.  New onset weakness or numbness of an extremity below the injection site  Hives or difficulty breathing (go to the emergency room)  Inflammation or drainage at the infection site  Severe back/neck pain  Any new symptoms which are concerning to  you  Please note:  Although the local anesthetic injected can often make your back or neck feel good for several hours after the injection, the pain will likely return.  It takes 3-7 days for steroids to work in the epidural space.  You may not notice any pain relief for at least that one week.  If effective, we will often do a series of three injections spaced 3-6 weeks apart to maximally decrease your pain.  After the initial series, we generally will wait several months before considering a repeat injection of the same type.  If you have any questions, please call [phone] [facility]

## 2016-11-21 NOTE — Progress Notes (Signed)
Safety precautions to be maintained throughout the outpatient stay will include: orient to surroundings, keep bed in low position, maintain call bell within reach at all times, provide assistance with transfer out of bed and ambulation.  

## 2016-11-21 NOTE — Patient Instructions (Addendum)
Repeat lumbar ESI  GENERAL RISKS AND COMPLICATIONS  What are the risk, side effects and possible complications? Generally speaking, most procedures are safe.  However, with any procedure there are risks, side effects, and the possibility of complications.  The risks and complications are dependent upon the sites that are lesioned, or the type of nerve block to be performed.  The closer the procedure is to the spine, the more serious the risks are.  Great care is taken when placing the radio frequency needles, block needles or lesioning probes, but sometimes complications can occur. 1. Infection: Any time there is an injection through the skin, there is a risk of infection.  This is why sterile conditions are used for these blocks.  There are four possible types of infection. 1. Localized skin infection. 2. Central Nervous System Infection-This can be in the form of Meningitis, which can be deadly. 3. Epidural Infections-This can be in the form of an epidural abscess, which can cause pressure inside of the spine, causing compression of the spinal cord with subsequent paralysis. This would require an emergency surgery to decompress, and there are no guarantees that the patient would recover from the paralysis. 4. Discitis-This is an infection of the intervertebral discs.  It occurs in about 1% of discography procedures.  It is difficult to treat and it may lead to surgery.        2. Pain: the needles have to go through skin and soft tissues, will cause soreness.       3. Damage to internal structures:  The nerves to be lesioned may be near blood vessels or    other nerves which can be potentially damaged.       4. Bleeding: Bleeding is more common if the patient is taking blood thinners such as  aspirin, Coumadin, Ticiid, Plavix, etc., or if he/she have some genetic predisposition  such as hemophilia. Bleeding into the spinal canal can cause compression of the spinal  cord with subsequent paralysis.   This would require an emergency surgery to  decompress and there are no guarantees that the patient would recover from the  paralysis.       5. Pneumothorax:  Puncturing of a lung is a possibility, every time a needle is introduced in  the area of the chest or upper back.  Pneumothorax refers to free air around the  collapsed lung(s), inside of the thoracic cavity (chest cavity).  Another two possible  complications related to a similar event would include: Hemothorax and Chylothorax.   These are variations of the Pneumothorax, where instead of air around the collapsed  lung(s), you may have blood or chyle, respectively.       6. Spinal headaches: They may occur with any procedures in the area of the spine.       7. Persistent CSF (Cerebro-Spinal Fluid) leakage: This is a rare problem, but may occur  with prolonged intrathecal or epidural catheters either due to the formation of a fistulous  track or a dural tear.       8. Nerve damage: By working so close to the spinal cord, there is always a possibility of  nerve damage, which could be as serious as a permanent spinal cord injury with  paralysis.       9. Death:  Although rare, severe deadly allergic reactions known as "Anaphylactic  reaction" can occur to any of the medications used.      10. Worsening of the symptoms:  We can  always make thing worse.  What are the chances of something like this happening? Chances of any of this occuring are extremely low.  By statistics, you have more of a chance of getting killed in a motor vehicle accident: while driving to the hospital than any of the above occurring .  Nevertheless, you should be aware that they are possibilities.  In general, it is similar to taking a shower.  Everybody knows that you can slip, hit your head and get killed.  Does that mean that you should not shower again?  Nevertheless always keep in mind that statistics do not mean anything if you happen to be on the wrong side of them.  Even if  a procedure has a 1 (one) in a 1,000,000 (million) chance of going wrong, it you happen to be that one..Also, keep in mind that by statistics, you have more of a chance of having something go wrong when taking medications.  Who should not have this procedure? If you are on a blood thinning medication (e.g. Coumadin, Plavix, see list of "Blood Thinners"), or if you have an active infection going on, you should not have the procedure.  If you are taking any blood thinners, please inform your physician.  How should I prepare for this procedure?  Do not eat or drink anything at least six hours prior to the procedure.  Bring a driver with you .  It cannot be a taxi.  Come accompanied by an adult that can drive you back, and that is strong enough to help you if your legs get weak or numb from the local anesthetic.  Take all of your medicines the morning of the procedure with just enough water to swallow them.  If you have diabetes, make sure that you are scheduled to have your procedure done first thing in the morning, whenever possible.  If you have diabetes, take only half of your insulin dose and notify our nurse that you have done so as soon as you arrive at the clinic.  If you are diabetic, but only take blood sugar pills (oral hypoglycemic), then do not take them on the morning of your procedure.  You may take them after you have had the procedure.  Do not take aspirin or any aspirin-containing medications, at least eleven (11) days prior to the procedure.  They may prolong bleeding.  Wear loose fitting clothing that may be easy to take off and that you would not mind if it got stained with Betadine or blood.  Do not wear any jewelry or perfume  Remove any nail coloring.  It will interfere with some of our monitoring equipment.  NOTE: Remember that this is not meant to be interpreted as a complete list of all possible complications.  Unforeseen problems may occur.  BLOOD THINNERS The  following drugs contain aspirin or other products, which can cause increased bleeding during surgery and should not be taken for 2 weeks prior to and 1 week after surgery.  If you should need take something for relief of minor pain, you may take acetaminophen which is found in Tylenol,m Datril, Anacin-3 and Panadol. It is not blood thinner. The products listed below are.  Do not take any of the products listed below in addition to any listed on your instruction sheet.  A.P.C or A.P.C with Codeine Codeine Phosphate Capsules #3 Ibuprofen Ridaura  ABC compound Congesprin Imuran rimadil  Advil Cope Indocin Robaxisal  Alka-Seltzer Effervescent Pain Reliever and Antacid Coricidin or  Coricidin-D  Indomethacin Rufen  Alka-Seltzer plus Cold Medicine Cosprin Ketoprofen S-A-C Tablets  Anacin Analgesic Tablets or Capsules Coumadin Korlgesic Salflex  Anacin Extra Strength Analgesic tablets or capsules CP-2 Tablets Lanoril Salicylate  Anaprox Cuprimine Capsules Levenox Salocol  Anexsia-D Dalteparin Magan Salsalate  Anodynos Darvon compound Magnesium Salicylate Sine-off  Ansaid Dasin Capsules Magsal Sodium Salicylate  Anturane Depen Capsules Marnal Soma  APF Arthritis pain formula Dewitt's Pills Measurin Stanback  Argesic Dia-Gesic Meclofenamic Sulfinpyrazone  Arthritis Bayer Timed Release Aspirin Diclofenac Meclomen Sulindac  Arthritis pain formula Anacin Dicumarol Medipren Supac  Analgesic (Safety coated) Arthralgen Diffunasal Mefanamic Suprofen  Arthritis Strength Bufferin Dihydrocodeine Mepro Compound Suprol  Arthropan liquid Dopirydamole Methcarbomol with Aspirin Synalgos  ASA tablets/Enseals Disalcid Micrainin Tagament  Ascriptin Doan's Midol Talwin  Ascriptin A/D Dolene Mobidin Tanderil  Ascriptin Extra Strength Dolobid Moblgesic Ticlid  Ascriptin with Codeine Doloprin or Doloprin with Codeine Momentum Tolectin  Asperbuf Duoprin Mono-gesic Trendar  Aspergum Duradyne Motrin or Motrin IB Triminicin   Aspirin plain, buffered or enteric coated Durasal Myochrisine Trigesic  Aspirin Suppositories Easprin Nalfon Trillsate  Aspirin with Codeine Ecotrin Regular or Extra Strength Naprosyn Uracel  Atromid-S Efficin Naproxen Ursinus  Auranofin Capsules Elmiron Neocylate Vanquish  Axotal Emagrin Norgesic Verin  Azathioprine Empirin or Empirin with Codeine Normiflo Vitamin E  Azolid Emprazil Nuprin Voltaren  Bayer Aspirin plain, buffered or children's or timed BC Tablets or powders Encaprin Orgaran Warfarin Sodium  Buff-a-Comp Enoxaparin Orudis Zorpin  Buff-a-Comp with Codeine Equegesic Os-Cal-Gesic   Buffaprin Excedrin plain, buffered or Extra Strength Oxalid   Bufferin Arthritis Strength Feldene Oxphenbutazone   Bufferin plain or Extra Strength Feldene Capsules Oxycodone with Aspirin   Bufferin with Codeine Fenoprofen Fenoprofen Pabalate or Pabalate-SF   Buffets II Flogesic Panagesic   Buffinol plain or Extra Strength Florinal or Florinal with Codeine Panwarfarin   Buf-Tabs Flurbiprofen Penicillamine   Butalbital Compound Four-way cold tablets Penicillin   Butazolidin Fragmin Pepto-Bismol   Carbenicillin Geminisyn Percodan   Carna Arthritis Reliever Geopen Persantine   Carprofen Gold's salt Persistin   Chloramphenicol Goody's Phenylbutazone   Chloromycetin Haltrain Piroxlcam   Clmetidine heparin Plaquenil   Cllnoril Hyco-pap Ponstel   Clofibrate Hydroxy chloroquine Propoxyphen         Before stopping any of these medications, be sure to consult the physician who ordered them.  Some, such as Coumadin (Warfarin) are ordered to prevent or treat serious conditions such as "deep thrombosis", "pumonary embolisms", and other heart problems.  The amount of time that you may need off of the medication may also vary with the medication and the reason for which you were taking it.  If you are taking any of these medications, please make sure you notify your pain physician before you undergo any  procedures.         Epidural Steroid Injection Patient Information  Description: The epidural space surrounds the nerves as they exit the spinal cord.  In some patients, the nerves can be compressed and inflamed by a bulging disc or a tight spinal canal (spinal stenosis).  By injecting steroids into the epidural space, we can bring irritated nerves into direct contact with a potentially helpful medication.  These steroids act directly on the irritated nerves and can reduce swelling and inflammation which often leads to decreased pain.  Epidural steroids may be injected anywhere along the spine and from the neck to the low back depending upon the location of your pain.   After numbing the skin with local anesthetic (like Novocaine),  a small needle is passed into the epidural space slowly.  You may experience a sensation of pressure while this is being done.  The entire block usually last less than 10 minutes.  Conditions which may be treated by epidural steroids:   Low back and leg pain  Neck and arm pain  Spinal stenosis  Post-laminectomy syndrome  Herpes zoster (shingles) pain  Pain from compression fractures  Preparation for the injection:  1. Do not eat any solid food or dairy products within 8 hours of your appointment.  2. You may drink clear liquids up to 3 hours before appointment.  Clear liquids include water, black coffee, juice or soda.  No milk or cream please. 3. You may take your regular medication, including pain medications, with a sip of water before your appointment  Diabetics should hold regular insulin (if taken separately) and take 1/2 normal NPH dos the morning of the procedure.  Carry some sugar containing items with you to your appointment. 4. A driver must accompany you and be prepared to drive you home after your procedure.  5. Bring all your current medications with your. 6. An IV may be inserted and sedation may be given at the discretion of the  physician.   7. A blood pressure cuff, EKG and other monitors will often be applied during the procedure.  Some patients may need to have extra oxygen administered for a short period. 8. You will be asked to provide medical information, including your allergies, prior to the procedure.  We must know immediately if you are taking blood thinners (like Coumadin/Warfarin)  Or if you are allergic to IV iodine contrast (dye). We must know if you could possible be pregnant.  Possible side-effects:  Bleeding from needle site  Infection (rare, may require surgery)  Nerve injury (rare)  Numbness & tingling (temporary)  Difficulty urinating (rare, temporary)  Spinal headache ( a headache worse with upright posture)  Light -headedness (temporary)  Pain at injection site (several days)  Decreased blood pressure (temporary)  Weakness in arm/leg (temporary)  Pressure sensation in back/neck (temporary)  Call if you experience:  Fever/chills associated with headache or increased back/neck pain.  Headache worsened by an upright position.  New onset weakness or numbness of an extremity below the injection site  Hives or difficulty breathing (go to the emergency room)  Inflammation or drainage at the infection site  Severe back/neck pain  Any new symptoms which are concerning to you  Please note:  Although the local anesthetic injected can often make your back or neck feel good for several hours after the injection, the pain will likely return.  It takes 3-7 days for steroids to work in the epidural space.  You may not notice any pain relief for at least that one week.  If effective, we will often do a series of three injections spaced 3-6 weeks apart to maximally decrease your pain.  After the initial series, we generally will wait several months before considering a repeat injection of the same type.  If you have any questions, please call [phone] [facility]

## 2016-12-04 ENCOUNTER — Ambulatory Visit (HOSPITAL_BASED_OUTPATIENT_CLINIC_OR_DEPARTMENT_OTHER): Payer: 59 | Admitting: Student in an Organized Health Care Education/Training Program

## 2016-12-04 ENCOUNTER — Encounter: Payer: Self-pay | Admitting: Student in an Organized Health Care Education/Training Program

## 2016-12-04 ENCOUNTER — Ambulatory Visit
Admission: RE | Admit: 2016-12-04 | Discharge: 2016-12-04 | Disposition: A | Discharge status: Home / Self Care | Payer: 59 | Source: Ambulatory Visit | Attending: Student in an Organized Health Care Education/Training Program | Admitting: Student in an Organized Health Care Education/Training Program

## 2016-12-04 VITALS — BP 126/82 | HR 74 | Temp 97.7°F | Resp 14 | Ht 72.0 in | Wt 170.0 lb

## 2016-12-04 DIAGNOSIS — M48061 Spinal stenosis, lumbar region without neurogenic claudication: Secondary | ICD-10-CM | POA: Diagnosis not present

## 2016-12-04 DIAGNOSIS — M5116 Intervertebral disc disorders with radiculopathy, lumbar region: Secondary | ICD-10-CM | POA: Diagnosis not present

## 2016-12-04 DIAGNOSIS — M5416 Radiculopathy, lumbar region: Secondary | ICD-10-CM

## 2016-12-04 DIAGNOSIS — M5136 Other intervertebral disc degeneration, lumbar region: Secondary | ICD-10-CM | POA: Diagnosis not present

## 2016-12-04 DIAGNOSIS — M9983 Other biomechanical lesions of lumbar region: Secondary | ICD-10-CM

## 2016-12-04 MED ORDER — SODIUM CHLORIDE 0.9 % IJ SOLN
INTRAMUSCULAR | Status: AC
  Filled 2016-12-04: qty 10

## 2016-12-04 MED ORDER — LACTATED RINGERS IV SOLN
1000.0000 mL | Freq: Once | INTRAVENOUS | Status: AC
  Administered 2016-12-04: 1000 mL via INTRAVENOUS

## 2016-12-04 MED ORDER — ONDANSETRON HCL 4 MG/2ML IJ SOLN
INTRAMUSCULAR | Status: AC
  Filled 2016-12-04: qty 2

## 2016-12-04 MED ORDER — DEXAMETHASONE SODIUM PHOSPHATE 10 MG/ML IJ SOLN
10.0000 mg | Freq: Once | INTRAMUSCULAR | Status: AC
  Administered 2016-12-04: 10 mg

## 2016-12-04 MED ORDER — LIDOCAINE HCL (PF) 1 % IJ SOLN
10.0000 mL | Freq: Once | INTRAMUSCULAR | Status: AC
  Administered 2016-12-04: 5 mL

## 2016-12-04 MED ORDER — ROPIVACAINE HCL 2 MG/ML IJ SOLN
2.0000 mL | Freq: Once | INTRAMUSCULAR | Status: AC
  Administered 2016-12-04: 10 mL via EPIDURAL

## 2016-12-04 MED ORDER — DEXAMETHASONE SODIUM PHOSPHATE 10 MG/ML IJ SOLN
INTRAMUSCULAR | Status: AC
  Filled 2016-12-04: qty 1

## 2016-12-04 MED ORDER — ROPIVACAINE HCL 2 MG/ML IJ SOLN
INTRAMUSCULAR | Status: AC
  Filled 2016-12-04: qty 10

## 2016-12-04 MED ORDER — IOPAMIDOL (ISOVUE-M 200) INJECTION 41%
10.0000 mL | Freq: Once | INTRAMUSCULAR | Status: AC
  Administered 2016-12-04: 10 mL via EPIDURAL
  Filled 2016-12-04: qty 10

## 2016-12-04 MED ORDER — LIDOCAINE HCL (PF) 1 % IJ SOLN
INTRAMUSCULAR | Status: AC
  Filled 2016-12-04: qty 5

## 2016-12-04 MED ORDER — SODIUM CHLORIDE 0.9% FLUSH
2.0000 mL | Freq: Once | INTRAVENOUS | Status: AC
  Administered 2016-12-04: 10 mL

## 2016-12-04 MED ORDER — FENTANYL CITRATE (PF) 100 MCG/2ML IJ SOLN
25.0000 ug | INTRAMUSCULAR | Status: DC | PRN
  Administered 2016-12-04: 50 ug via INTRAVENOUS

## 2016-12-04 MED ORDER — FENTANYL CITRATE (PF) 100 MCG/2ML IJ SOLN
INTRAMUSCULAR | Status: AC
  Filled 2016-12-04: qty 2

## 2016-12-04 MED ORDER — ONDANSETRON HCL 4 MG/2ML IJ SOLN
4.0000 mg | Freq: Once | INTRAMUSCULAR | Status: AC
  Administered 2016-12-04: 4 mg via INTRAVENOUS

## 2016-12-04 NOTE — Progress Notes (Signed)
Patient's Name: [name]  MRN: [id number]  Referring Provider: Edward Jolly, MD  DOB: 10/29/1970  PCP: [name], MD  DOS: 12/04/2016  Note by: Edward Jolly, MD  Service setting: Ambulatory outpatient  Specialty: Interventional Pain Management  Patient type: Established  Location: [facility] (AMB) Pain Management Facility  Visit type: Interventional Procedure   Primary Reason for Visit: Interventional Pain Management Treatment. CC: Back Pain (low and right)  Procedure:  Anesthesia, Analgesia, Anxiolysis:  Type: Therapeutic Inter-Laminar Epidural Steroid Injection #2 Region: Lumbar Level: L5-S1 Level. Laterality: Right-Sided         Type: Local Anesthesia with Moderate (Conscious) Sedation Local Anesthetic: Lidocaine 1% Route: Intravenous (IV) IV Access: Secured Sedation: Meaningful verbal contact was maintained at all times during the procedure  Indication(s): Analgesia and Anxiety  Indications: 1. Lumbar degenerative disc disease   2. Lumbar radiculopathy   3. Neuroforaminal stenosis of lumbar spine    Pain Score: Pre-procedure: 3 /10 Post-procedure: 0-No pain/10  Pre-op Assessment:  Gregory Guzman is a 46 y.o. (year old), male patient, seen today for interventional treatment. He  has a past surgical history that includes Back surgery; Nasal sinus surgery; Lithotripsy (N/A); Cystoscopy/ureteroscopy/holmium laser/stent placement (Right, 03/01/2015); Cystoscopy w/ retrogrades (Left, 03/01/2015); Ureteroscopy with holmium laser lithotripsy (Right, 03/08/2015); and Cystoscopy with stent placement (Right, 03/08/2015). Gregory Guzman has a current medication list which includes the following prescription(s): cetirizine, citalopram, epinephrine, losartan, and pravastatin, and the following Facility-Administered Medications: fentanyl. His primarily concern today is the Back Pain (low and right)  Patient is a 46 year old male with a past medical history of lumbar microdiscectomy at L4-L5  approximately 27 years ago secondary to a herniated disc from a lifting accident.   MRI findings and patient's clinical history and exam are consistent with an acute on chronic lumbosacral radiculopathy, right greater than left, affecting L4-L5 and L5-S1. Patient's MRI shows left foraminal and foraminal disc protrusions at L4-L5 and L3-L4. He also has a small central disc extrusion with superior migration of 6 mm at the L5-S1 without any spinal canal or lateral recess stenosis. Patient does not have any central lumbar spinal canal stenosis.  Patient is status post lumbar epidural steroid injection on August 15 with moderate relief of his radicular pain symptoms. He feels like it is sharp shooting pain down into his right leg is not as constant and severe as before. It is more intermittent in frequency.  Plan: -Repeat lumbar epidural steroid injection #2  Initial Vital Signs: Blood pressure (!) 133/93, pulse 74, temperature 97.7 F (36.5 C), temperature source Oral, resp. rate 18, height 6' (1.829 m), weight 170 lb (77.1 kg), SpO2 98 %. BMI: Estimated body mass index is 23.06 kg/m as calculated from the following:   Height as of this encounter: 6' (1.829 m).   Weight as of this encounter: 170 lb (77.1 kg).  Risk Assessment: Allergies: Reviewed. He has No Known Allergies.  Allergy Precautions: None required Coagulopathies: Reviewed. None identified.  Blood-thinner therapy: None at this time Active Infection(s): Reviewed. None identified. Gregory Guzman is afebrile  Site Confirmation: Gregory Guzman was asked to confirm the procedure and laterality before marking the site Procedure checklist: Completed Consent: Before the procedure and under the influence of no sedative(s), amnesic(s), or anxiolytics, the patient was informed of the treatment options, risks and possible complications. To fulfill our ethical and legal obligations, as recommended by the American Medical Association's Code of Ethics, I have  informed the patient of my clinical impression; the nature  and purpose of the treatment or procedure; the risks, benefits, and possible complications of the intervention; the alternatives, including doing nothing; the risk(s) and benefit(s) of the alternative treatment(s) or procedure(s); and the risk(s) and benefit(s) of doing nothing. The patient was provided information about the general risks and possible complications associated with the procedure. These may include, but are not limited to: failure to achieve desired goals, infection, bleeding, organ or nerve damage, allergic reactions, paralysis, and death. In addition, the patient was informed of those risks and complications associated to Spine-related procedures, such as failure to decrease pain; infection (i.e.: Meningitis, epidural or intraspinal abscess); bleeding (i.e.: epidural hematoma, subarachnoid hemorrhage, or any other type of intraspinal or peri-dural bleeding); organ or nerve damage (i.e.: Any type of peripheral nerve, nerve root, or spinal cord injury) with subsequent damage to sensory, motor, and/or autonomic systems, resulting in permanent pain, numbness, and/or weakness of one or several areas of the body; allergic reactions; (i.e.: anaphylactic reaction); and/or death. Furthermore, the patient was informed of those risks and complications associated with the medications. These include, but are not limited to: allergic reactions (i.e.: anaphylactic or anaphylactoid reaction(s)); adrenal axis suppression; blood sugar elevation that in diabetics may result in ketoacidosis or comma; water retention that in patients with history of congestive heart failure may result in shortness of breath, pulmonary edema, and decompensation with resultant heart failure; weight gain; swelling or edema; medication-induced neural toxicity; particulate matter embolism and blood vessel occlusion with resultant organ, and/or nervous system infarction; and/or  aseptic necrosis of one or more joints. Finally, the patient was informed that Medicine is not an exact science; therefore, there is also the possibility of unforeseen or unpredictable risks and/or possible complications that may result in a catastrophic outcome. The patient indicated having understood very clearly. We have given the patient no guarantees and we have made no promises. Enough time was given to the patient to ask questions, all of which were answered to the patient's satisfaction. Gregory Guzman has indicated that he wanted to continue with the procedure. Attestation: I, the ordering provider, attest that I have discussed with the patient the benefits, risks, side-effects, alternatives, likelihood of achieving goals, and potential problems during recovery for the procedure that I have provided informed consent. Date: 12/04/2016; Time: 8:43 AM  Pre-Procedure Preparation:  Monitoring: As per clinic protocol. Respiration, ETCO2, SpO2, BP, heart rate and rhythm monitor placed and checked for adequate function Safety Precautions: Patient was assessed for positional comfort and pressure points before starting the procedure. Time-out: I initiated and conducted the "Time-out" before starting the procedure, as per protocol. The patient was asked to participate by confirming the accuracy of the "Time Out" information. Verification of the correct person, site, and procedure were performed and confirmed by me, the nursing staff, and the patient. "Time-out" conducted as per Joint Commission's Universal Protocol (UP.01.01.01). "Time-out" Date & Time: 12/04/2016; 0910 hrs.  Description of Procedure Process:   Position: Prone with head of the table was raised to facilitate breathing. Target Area: The interlaminar space, initially targeting the lower laminar border of the superior vertebral body. Approach: Paramedial approach. Area Prepped: Entire Posterior Lumbar Region Prepping solution: ChloraPrep (2%  chlorhexidine gluconate and 70% isopropyl alcohol) Safety Precautions: Aspiration looking for blood return was conducted prior to all injections. At no point did we inject any substances, as a needle was being advanced. No attempts were made at seeking any paresthesias. Safe injection practices and needle disposal techniques used. Medications properly checked for expiration  dates. SDV (single dose vial) medications used. Description of the Procedure: Protocol guidelines were followed. The procedure needle was introduced through the skin, ipsilateral to the reported pain, and advanced to the target area. Bone was contacted and the needle walked caudad, until the lamina was cleared. The epidural space was identified using "loss-of-resistance technique" with 2-3 ml of PF-NaCl (0.9% NSS), in a 5cc LOR glass syringe. Vitals:   12/04/16 0923 12/04/16 0929 12/04/16 0939 12/04/16 0949  BP: 121/85 132/86 (!) 141/90 126/82  Pulse: 77 73 76 74  Resp: Temp:      TempSrc:      SpO2: 97% 96% 98% 99%  Weight:      Height:        Start Time: 0910 hrs. End Time: 0920 hrs. Materials:  Needle(s) Type: Epidural needle Gauge: 17G Length: 3.5-in Medication(s): We administered lactated ringers, fentaNYL, iopamidol, sodium chloride flush, ropivacaine (PF) 2 mg/mL (0.2%), lidocaine (PF), dexamethasone, and ondansetron. Please see chart orders for dosing details. 5 cc of normal saline, 2 cc of 0.2% ropivacaine, 1 cc of Decadron. Could only get 6 out of 8 cc due to patient feeling increased pressure in his back and legs that did not resolve after a couple of seconds and patient getting nauseous secondary to fentanyl. Imaging Guidance (Spinal):  Type of Imaging Technique: Fluoroscopy Guidance (Spinal) Indication(s): Assistance in needle guidance and placement for procedures requiring needle placement in or near specific anatomical locations not easily accessible without such assistance. Exposure Time:  Please see nurses notes. Contrast: Before injecting any contrast, we confirmed that the patient did not have an allergy to iodine, shellfish, or radiological contrast. Once satisfactory needle placement was completed at the desired level, radiological contrast was injected. Contrast injected under live fluoroscopy. No contrast complications. See chart for type and volume of contrast used. Fluoroscopic Guidance: I was personally present during the use of fluoroscopy. "Tunnel Vision Technique" used to obtain the best possible view of the target area. Parallax error corrected before commencing the procedure. "Direction-depth-direction" technique used to introduce the needle under continuous pulsed fluoroscopy. Once target was reached, antero-posterior, oblique, and lateral fluoroscopic projection used confirm needle placement in all planes. Images permanently stored in EMR. Interpretation: I personally interpreted the imaging intraoperatively. Adequate needle placement confirmed in multiple planes. Appropriate spread of contrast into desired area was observed. No evidence of afferent or efferent intravascular uptake. No intrathecal or subarachnoid spread observed. Permanent images saved into the patient's record.  Antibiotic Prophylaxis:  Indication(s): None identified Antibiotic given: None  Post-operative Assessment:  EBL: None Complications: No immediate post-treatment complications observed by team, or reported by patient. Note: The patient tolerated the entire procedure well. A repeat set of vitals were taken after the procedure and the patient was kept under observation following institutional policy, for this type of procedure. Post-procedural neurological assessment was performed, showing return to baseline, prior to discharge. The patient was provided with post-procedure discharge instructions, including a section on how to identify potential problems. Should any problems arise concerning this  procedure, the patient was given instructions to immediately contact us, at any time, without hesitation. In any case, we plan to contact the patient by telephone for a follow-up status report regarding this interventional procedure. Comments:  Patient did have some nausea secondary to fentanyl intake and being nothing by mouth. Patient given fluid bolus of 500 cc and given Zofran 4 mg IV and patient feeling better.  Plan of Care  Disposition: Discharge  home  Discharge Date & Time: 12/04/2016; 0953 hrs.  Pain improved upon discharge. 5 out of 5 strength bilateral lower extremity on plantar flexion, dorsiflexion, knee flexion, knee extension  Physician-requested Follow-up:  Return in about 2 weeks (around 12/18/2016) for Post Procedure Evaluation.  Future Appointments Date Time Provider Department Center  12/17/2016 10:15 AM Edward JollyLateef, Milissa Fesperman, MD [facility] None    Imaging Orders     DG C-Arm 1-60 Min-No Report Procedure Orders    No procedure(s) ordered today    Medications ordered for procedure: Meds ordered this encounter  Medications  . lactated ringers infusion 1,000 mL  . fentaNYL (SUBLIMAZE) injection 25-50 mcg    Make sure Narcan is available in the pyxis when using this medication. In the event of respiratory depression (RR< 8/min): Titrate NARCAN (naloxone) in increments of 0.1 to 0.2 mg IV at 2-3 minute intervals, until desired degree of reversal.  . iopamidol (ISOVUE-M) 41 % intrathecal injection 10 mL  . sodium chloride flush (NS) 0.9 % injection 2 mL  . ropivacaine (PF) 2 mg/mL (0.2%) (NAROPIN) injection 2 mL  . lidocaine (PF) (XYLOCAINE) 1 % injection 10 mL  . dexamethasone (DECADRON) injection 10 mg  . ondansetron (ZOFRAN) injection 4 mg   Medications administered: We administered lactated ringers, fentaNYL, iopamidol, sodium chloride flush, ropivacaine (PF) 2 mg/mL (0.2%), lidocaine (PF), dexamethasone, and ondansetron.  See the medical record for exact dosing, route,  and time of administration.  New Prescriptions   No medications on file   Primary Care Physician: Rafael BihariWalker, John B III, MD [facility] Note by: Edward JollyBilal Chelcey Caputo, MD Date: 12/04/2016; Time: 1:58 PM  Disclaimer:  Medicine is not an exact science. The only guarantee in medicine is that nothing is guaranteed. It is important to note that the decision to proceed with this intervention was based on the information collected from the patient. The Data and conclusions were drawn from the patient's questionnaire, the interview, and the physical examination. Because the information was provided in large part by the patient, it cannot be guaranteed that it has not been purposely or unconsciously manipulated. Every effort has been made to obtain as much relevant data as possible for this evaluation. It is important to note that the conclusions that lead to this procedure are derived in large part from the available data. Always take into account that the treatment will also be dependent on availability of resources and existing treatment guidelines, considered by other Pain Management Practitioners as being common knowledge and practice, at the time of the intervention. For Medico-Legal purposes, it is also important to point out that variation in procedural techniques and pharmacological choices are the acceptable norm. The indications, contraindications, technique, and results of the above procedure should only be interpreted and judged by a Board-Certified Interventional Pain Specialist with extensive familiarity and expertise in the same exact procedure and technique.

## 2016-12-04 NOTE — Progress Notes (Signed)
Safety precautions to be maintained throughout the outpatient stay will include: orient to surroundings, keep bed in low position, maintain call bell within reach at all times, provide assistance with transfer out of bed and ambulation.   [id number] Patient complaining of nausea. Dr. Cherylann RatelLateef in room. Zofran 4 mg given.  [id number] States nausea better.  0950 Pt received 550ml of LR per verbal order.

## 2016-12-04 NOTE — Patient Instructions (Signed)

## 2016-12-05 ENCOUNTER — Telehealth: Payer: Self-pay | Admitting: *Deleted

## 2016-12-05 NOTE — Telephone Encounter (Signed)
No problems post procedure. 

## 2016-12-12 DIAGNOSIS — E7800 Pure hypercholesterolemia, unspecified: Secondary | ICD-10-CM | POA: Diagnosis not present

## 2016-12-12 DIAGNOSIS — I10 Essential (primary) hypertension: Secondary | ICD-10-CM | POA: Diagnosis not present

## 2016-12-17 ENCOUNTER — Ambulatory Visit
Payer: 59 | Attending: Student in an Organized Health Care Education/Training Program | Admitting: Student in an Organized Health Care Education/Training Program

## 2016-12-17 ENCOUNTER — Encounter: Payer: Self-pay | Admitting: Student in an Organized Health Care Education/Training Program

## 2016-12-17 VITALS — BP 139/88 | HR 72 | Temp 98.0°F | Resp 16 | Ht 72.0 in | Wt 170.0 lb

## 2016-12-17 DIAGNOSIS — G8929 Other chronic pain: Secondary | ICD-10-CM | POA: Diagnosis not present

## 2016-12-17 DIAGNOSIS — M5136 Other intervertebral disc degeneration, lumbar region: Secondary | ICD-10-CM | POA: Diagnosis not present

## 2016-12-17 DIAGNOSIS — M545 Low back pain: Secondary | ICD-10-CM | POA: Diagnosis not present

## 2016-12-17 DIAGNOSIS — M51369 Other intervertebral disc degeneration, lumbar region without mention of lumbar back pain or lower extremity pain: Secondary | ICD-10-CM

## 2016-12-17 DIAGNOSIS — M5116 Intervertebral disc disorders with radiculopathy, lumbar region: Secondary | ICD-10-CM | POA: Diagnosis not present

## 2016-12-17 DIAGNOSIS — M9983 Other biomechanical lesions of lumbar region: Secondary | ICD-10-CM | POA: Diagnosis not present

## 2016-12-17 DIAGNOSIS — E785 Hyperlipidemia, unspecified: Secondary | ICD-10-CM | POA: Insufficient documentation

## 2016-12-17 DIAGNOSIS — M5417 Radiculopathy, lumbosacral region: Secondary | ICD-10-CM | POA: Diagnosis not present

## 2016-12-17 DIAGNOSIS — Z87442 Personal history of urinary calculi: Secondary | ICD-10-CM | POA: Diagnosis not present

## 2016-12-17 DIAGNOSIS — F329 Major depressive disorder, single episode, unspecified: Secondary | ICD-10-CM | POA: Diagnosis not present

## 2016-12-17 DIAGNOSIS — M48061 Spinal stenosis, lumbar region without neurogenic claudication: Secondary | ICD-10-CM

## 2016-12-17 DIAGNOSIS — M5416 Radiculopathy, lumbar region: Secondary | ICD-10-CM | POA: Diagnosis not present

## 2016-12-17 DIAGNOSIS — R0681 Apnea, not elsewhere classified: Secondary | ICD-10-CM | POA: Diagnosis not present

## 2016-12-17 NOTE — Progress Notes (Signed)
Patient's Name: Gregory Guzman  MRN: [id number]  Referring Provider: Madelyn Brunner, MD  DOB: [birth year]  PCP: Madelyn Brunner, MD  DOS: 12/17/2016  Note by: Gillis Santa, MD  Service setting: Ambulatory outpatient  Specialty: Interventional Pain Management  Location: [facility] (AMB) Pain Management Facility    Patient type: Established   Primary Reason(s) for Visit: Encounter for post-procedure evaluation of chronic illness with mild to moderate exacerbation CC: Back Pain (lower)  HPI  Gregory Guzman is a 46 y.o. year old, male patient, who comes today for a post-procedure evaluation. He has Clinical depression; HLD (hyperlipidemia); and Apnea, sleep on his problem list. His primarily concern today is the Back Pain (lower)  Pain Assessment: Location: Lower Back Radiating: n/a Onset: More than a month ago Duration: Chronic pain Quality: Sharp (solid) Severity: 2 /10 (self-reported pain score)  Note: Reported level is compatible with observation.                   When using our objective Pain Scale, any level above a 5/10 is said to belong in an emergency room, as it progressively worsens from a 6/10, which is described as severely limiting. Requiring emergency care not usually available at an outpatient pain management facility. Communication becomes difficult and requires great effort. Assistance to reach the emergency department may be required. Facial flushing and profuse sweating along with potentially dangerous increases in heart rate and blood pressure will be evident. Effect on ADL:   Timing: Intermittent Modifying factors: sitting, changing positions  Gregory Guzman comes in today for post-procedure evaluation after the treatment done on 12/04/2016.  Further details on both, my assessment(s), as well as the proposed treatment plan, please see below.  Patient is a 46 year old male with a past medical history of lumbar microdiscectomy at L4-L5 approximately 27 years ago secondary to a herniated  disc from a lifting accident.  MRI findings and patient's clinical history and exam are consistent with an acute on chronic lumbosacral radiculopathy, right greater than left, affecting L4-L5 and L5-S1. Patient's MRI shows left foraminal and foraminal disc protrusions at L4-L5 and L3-L4. He also has a small central disc extrusion with superior migration of 6 mm at the L5-S1 without any spinal canal or lateral recess stenosis. Patient does not have any central lumbar spinal canal stenosis.  Patient is status post lumbar epidural steroid injection on August 15 and then subsequently on September 12 with moderate to significant relief of his radicular pain symptoms. He feels like it is sharp shooting pain down into his right leg is not as constant and severe as before. Patient is complaining of more focal right paraspinal pain that may be more myofascial related. Patient has started physical therapy today.  Post-Procedure Assessment  12/04/2016 Procedure: L5-S1 ESI #2 directed towards right Pre-procedure pain score:  3/10 Post-procedure pain score: 0/10         Influential Factors: BMI: 23.06 kg/m Intra-procedural challenges: None observed.         Assessment challenges: None detected.              Reported side-effects: None.        Post-procedural adverse reactions or complications: None reported         Sedation: Please see nurses note. When no sedatives are used, the analgesic levels obtained are directly associated to the effectiveness of the local anesthetics. However, when sedation is provided, the level of analgesia obtained during the initial 1 hour following the  intervention, is believed to be the result of a combination of factors. These factors may include, but are not limited to: 1. The effectiveness of the local anesthetics used. 2. The effects of the analgesic(s) and/or anxiolytic(s) used. 3. The degree of discomfort experienced by the patient at the time of the procedure. 4. The  patients ability and reliability in recalling and recording the events. 5. The presence and influence of possible secondary gains and/or psychosocial factors. Reported result: Relief experienced during the 1st hour after the procedure: 100 % (Ultra-Short Term Relief)            Interpretative annotation: Clinically appropriate result. Analgesia during this period is likely to be Local Anesthetic and/or IV Sedative (Analgesic/Anxiolytic) related.          Effects of local anesthetic: The analgesic effects attained during this period are directly associated to the localized infiltration of local anesthetics and therefore cary significant diagnostic value as to the etiological location, or anatomical origin, of the pain. Expected duration of relief is directly dependent on the pharmacodynamics of the local anesthetic used. Long-acting (4-6 hours) anesthetics used.  Reported result: Relief during the next 4 to 6 hour after the procedure: 100 % (Short-Term Relief)            Interpretative annotation: Clinically appropriate result. Analgesia during this period is likely to be Local Anesthetic-related.          Long-term benefit: Defined as the period of time past the expected duration of local anesthetics (1 hour for short-acting and 4-6 hours for long-acting). With the possible exception of prolonged sympathetic blockade from the local anesthetics, benefits during this period are typically attributed to, or associated with, other factors such as analgesic sensory neuropraxia, antiinflammatory effects, or beneficial biochemical changes provided by agents other than the local anesthetics.  Reported result: Extended relief following procedure: 50 % (Long-Term Relief)            Interpretative annotation: Clinically appropriate result. Good relief. Therapeutic success. Inflammation plays a part in the etiology to the pain.          Current benefits: Defined as reported results that persistent at this point in  time.   Analgesia: 50-75 %            Function: Gregory Guzman reports improvement in function ROM: Somewhat improved Interpretative annotation: Recurrence of symptoms. Limited therapeutic benefit. Effective therapeutic approach.          Interpretation: Results would suggest a successful diagnostic and therapeutic intervention.                  Plan:  Please see "Plan of Care" for details.       Laboratory Chemistry  Inflammation Markers (CRP: Acute Phase) (ESR: Chronic Phase) No results found for: CRP, ESRSEDRATE               Renal Function Markers Lab Results  Component Value Date   BUN 12 02/23/2015   CREATININE 1.11 02/23/2015   GFRAA >60 02/23/2015   GFRNONAA >60 02/23/2015                 Hepatic Function Markers No results found for: AST, ALT, ALBUMIN, ALKPHOS, HCVAB               Electrolytes Lab Results  Component Value Date   NA 140 02/23/2015   K 4.7 02/23/2015   CL 107 02/23/2015   CALCIUM 8.8 (L) 02/23/2015  Neuropathy Markers No results found for: [id number]               Bone Pathology Markers Lab Results  Component Value Date   CALCIUM 8.8 (L) 02/23/2015                 Coagulation Parameters Lab Results  Component Value Date   PLT 248 02/23/2015                 Cardiovascular Markers Lab Results  Component Value Date   HGB 15.3 02/23/2015   HCT 45.6 02/23/2015                 Note: Lab results reviewed.  Recent Diagnostic Imaging Results  DG C-Arm 1-60 Min-No Report Fluoroscopy was utilized by the requesting physician.  No radiographic  interpretation.   Note: Imaging results reviewed.          Meds   Current Outpatient Prescriptions:  .  cetirizine (ZYRTEC) 10 MG tablet, Take 10 mg by mouth daily., Disp: , Rfl:  .  citalopram (CELEXA) 20 MG tablet, Take 40 mg by mouth every morning. , Disp: , Rfl:  .  EPINEPHrine (EPIPEN 2-PAK) 0.3 mg/0.3 mL IJ SOAJ injection, Reported on 04/14/2015, Disp: , Rfl:  .  losartan  (COZAAR) 25 MG tablet, Take 25 mg by mouth daily., Disp: , Rfl:  .  pravastatin (PRAVACHOL) 20 MG tablet, TK 1 T PO NIGHTLY, Disp: , Rfl: 5  ROS  Constitutional: Denies any fever or chills Gastrointestinal: No reported hemesis, hematochezia, vomiting, or acute GI distress Musculoskeletal: Denies any acute onset joint swelling, redness, loss of ROM, or weakness Neurological: No reported episodes of acute onset apraxia, aphasia, dysarthria, agnosia, amnesia, paralysis, loss of coordination, or loss of consciousness  Allergies  Gregory Guzman has No Known Allergies.  PFSH  Drug: Gregory Guzman  reports that he does not use drugs. Alcohol:  reports that he drinks alcohol. Tobacco:  reports that he has never smoked. He has never used smokeless tobacco. Medical:  has a past medical history of Allergy; Chicken pox; Chronic kidney disease; Depression; Difficult intubation; Hematuria; HLD (hyperlipidemia); Kidney stone; Rosacea; and Sleep apnea. Surgical: Gregory Guzman  has a past surgical history that includes Back surgery; Nasal sinus surgery; Lithotripsy (N/A); Cystoscopy/ureteroscopy/holmium laser/stent placement (Right, 03/01/2015); Cystoscopy w/ retrogrades (Left, 03/01/2015); Ureteroscopy with holmium laser lithotripsy (Right, 03/08/2015); and Cystoscopy with stent placement (Right, 03/08/2015). Family: family history includes Heart disease in his father and paternal grandmother; Kidney Stones in his maternal grandmother and mother; Thyroid disease in his sister.  Constitutional Exam  General appearance: Well nourished, well developed, and well hydrated. In no apparent acute distress Vitals:   12/17/16 1023  BP: 139/88  Pulse: 72  Resp: 16  Temp: 98 F (36.7 C)  TempSrc: Oral  SpO2: 99%  Weight: 170 lb (77.1 kg)  Height: 6' (1.829 m)   BMI Assessment: Estimated body mass index is 23.06 kg/m as calculated from the following:   Height as of this encounter: 6' (1.829 m).   Weight as of this encounter:  170 lb (77.1 kg).  BMI interpretation table: BMI level Category Range association with higher incidence of chronic pain  <18 kg/m2 Underweight   18.5-24.9 kg/m2 Ideal body weight   25-29.9 kg/m2 Overweight Increased incidence by 20%  30-34.9 kg/m2 Obese (Class I) Increased incidence by 68%  35-39.9 kg/m2 Severe obesity (Class II) Increased incidence by 136%  >40 kg/m2 Extreme obesity (Class III) Increased incidence by  254%   BMI Readings from Last 4 Encounters:  12/17/16 23.06 kg/m  12/04/16 23.06 kg/m  11/21/16 23.06 kg/m  11/06/16 23.33 kg/m   Wt Readings from Last 4 Encounters:  12/17/16 170 lb (77.1 kg)  12/04/16 170 lb (77.1 kg)  11/21/16 170 lb (77.1 kg)  11/06/16 172 lb (78 kg)  Psych/Mental status: Alert, oriented x 3 (person, place, & time)       Eyes: PERLA Respiratory: No evidence of acute respiratory distress  Cervical Spine Area Exam  Skin & Axial Inspection: No masses, redness, edema, swelling, or associated skin lesions Alignment: Symmetrical Functional ROM: Unrestricted ROM      Stability: No instability detected Muscle Tone/Strength: Functionally intact. No obvious neuro-muscular anomalies detected. Sensory (Neurological): Unimpaired Palpation: No palpable anomalies              Upper Extremity (UE) Exam    Side: Right upper extremity  Side: Left upper extremity  Skin & Extremity Inspection: Skin color, temperature, and hair growth are WNL. No peripheral edema or cyanosis. No masses, redness, swelling, asymmetry, or associated skin lesions. No contractures.  Skin & Extremity Inspection: Skin color, temperature, and hair growth are WNL. No peripheral edema or cyanosis. No masses, redness, swelling, asymmetry, or associated skin lesions. No contractures.  Functional ROM: Unrestricted ROM          Functional ROM: Unrestricted ROM          Muscle Tone/Strength: Functionally intact. No obvious neuro-muscular anomalies detected.  Muscle Tone/Strength:  Functionally intact. No obvious neuro-muscular anomalies detected.  Sensory (Neurological): Unimpaired          Sensory (Neurological): Unimpaired          Palpation: No palpable anomalies              Palpation: No palpable anomalies              Specialized Test(s): Deferred         Specialized Test(s): Deferred          Thoracic Spine Area Exam  Skin & Axial Inspection: No masses, redness, or swelling Alignment: Symmetrical Functional ROM: Unrestricted ROM Stability: No instability detected Muscle Tone/Strength: Functionally intact. No obvious neuro-muscular anomalies detected. Sensory (Neurological): Unimpaired Muscle strength & Tone: No palpable anomalies  Lumbar Spine Area Exam  Skin & Axial Inspection: No masses, redness, or swelling Alignment: Symmetrical Functional ROM: Unrestricted ROM      Stability: No instability detected Muscle Tone/Strength: Functionally intact. No obvious neuro-muscular anomalies detected. Sensory (Neurological): Unimpaired Palpation: No palpable anomalies       Provocative Tests: Lumbar Hyperextension and rotation test: Improved after treatment       Lumbar Lateral bending test: Improved after treatment       Patrick's Maneuver: evaluation deferred today                    Gait & Posture Assessment  Ambulation: Unassisted Gait: Relatively normal for age and body habitus Posture: WNL   Lower Extremity Exam    Side: Right lower extremity  Side: Left lower extremity  Skin & Extremity Inspection: Skin color, temperature, and hair growth are WNL. No peripheral edema or cyanosis. No masses, redness, swelling, asymmetry, or associated skin lesions. No contractures.  Skin & Extremity Inspection: Skin color, temperature, and hair growth are WNL. No peripheral edema or cyanosis. No masses, redness, swelling, asymmetry, or associated skin lesions. No contractures.  Functional ROM: Unrestricted ROM  Functional ROM: Unrestricted ROM          Muscle  Tone/Strength: Functionally intact. No obvious neuro-muscular anomalies detected.  Muscle Tone/Strength: Functionally intact. No obvious neuro-muscular anomalies detected.  Sensory (Neurological): Unimpaired  Sensory (Neurological): Unimpaired  Palpation: No palpable anomalies  Palpation: No palpable anomalies   Assessment  Primary Diagnosis & Pertinent Problem List: The primary encounter diagnosis was Lumbar radiculopathy. Diagnoses of Lumbar degenerative disc disease, Neuroforaminal stenosis of lumbar spine, Lumbosacral pain, chronic, and Lumbar radiculopathy, chronic were also pertinent to this visit.  Status Diagnosis  Improving Stable Improving 1. Lumbar radiculopathy   2. Lumbar degenerative disc disease   3. Neuroforaminal stenosis of lumbar spine   4. Lumbosacral pain, chronic   5. Lumbar radiculopathy, chronic      Patient is a 46 year old male with a past medical history of lumbar microdiscectomy at L4-L5 approximately 27 years ago secondary to a herniated disc from a lifting accident.  MRI findings and patient's clinical history and exam are consistent with an acute on chronic lumbosacral radiculopathy, right greater than left, affecting L4-L5 and L5-S1. Patient's MRI shows left foraminal and foraminal disc protrusions at L4-L5 and L3-L4. He also has a small central disc extrusion with superior migration of 6 mm at the L5-S1 without any spinal canal or lateral recess stenosis. Patient does not have any central lumbar spinal canal stenosis.  Patient is status post lumbar epidural steroid injection on August 15 and then subsequently on September 12 with moderate to significant relief of his radicular pain symptoms. He feels like it is sharp shooting pain down into his right leg is not as constant and severe as before. Patient is complaining of more focal right paraspinal pain that may be more myofascial related. Patient has started physical therapy today.  Plan: -Follow up in 6  weeks after completion of physical therapy to assess pain and range of motion. -Can consider repeating epidural steroid injection#3 at that time if having worsening back pain.   Provider-requested follow-up: Return in about 6 weeks (around 01/28/2017) for Medication Management.  No future appointments.  Primary Care Physician: Madelyn Brunner, MD Location: [facility] Note by: Gillis Santa, M.D Date: 12/17/2016; Time: 10:59 AM  Patient Instructions  Call for follow-up appointment in 6 weeks

## 2016-12-17 NOTE — Progress Notes (Signed)
Safety precautions to be maintained throughout the outpatient stay will include: orient to surroundings, keep bed in low position, maintain call bell within reach at all times, provide assistance with transfer out of bed and ambulation.  

## 2016-12-17 NOTE — Patient Instructions (Signed)
Call for follow-up appointment in 6 weeks

## 2016-12-19 DIAGNOSIS — M545 Low back pain: Secondary | ICD-10-CM | POA: Diagnosis not present

## 2016-12-21 DIAGNOSIS — L0103 Bullous impetigo: Secondary | ICD-10-CM | POA: Diagnosis not present

## 2016-12-25 DIAGNOSIS — M545 Low back pain: Secondary | ICD-10-CM | POA: Diagnosis not present

## 2016-12-27 DIAGNOSIS — M545 Low back pain: Secondary | ICD-10-CM | POA: Diagnosis not present

## 2016-12-30 DIAGNOSIS — M545 Low back pain: Secondary | ICD-10-CM | POA: Diagnosis not present

## 2017-01-01 DIAGNOSIS — Z5181 Encounter for therapeutic drug level monitoring: Secondary | ICD-10-CM | POA: Diagnosis not present

## 2017-01-01 DIAGNOSIS — Z0000 Encounter for general adult medical examination without abnormal findings: Secondary | ICD-10-CM | POA: Diagnosis not present

## 2017-01-01 DIAGNOSIS — M545 Low back pain: Secondary | ICD-10-CM | POA: Diagnosis not present

## 2017-01-01 DIAGNOSIS — I10 Essential (primary) hypertension: Secondary | ICD-10-CM | POA: Diagnosis not present

## 2017-01-06 DIAGNOSIS — M545 Low back pain: Secondary | ICD-10-CM | POA: Diagnosis not present

## 2017-01-08 DIAGNOSIS — M545 Low back pain: Secondary | ICD-10-CM | POA: Diagnosis not present

## 2017-01-09 ENCOUNTER — Encounter: Payer: Self-pay | Admitting: Student in an Organized Health Care Education/Training Program

## 2017-01-09 ENCOUNTER — Ambulatory Visit
Payer: 59 | Attending: Student in an Organized Health Care Education/Training Program | Admitting: Student in an Organized Health Care Education/Training Program

## 2017-01-09 VITALS — BP 143/98 | HR 70 | Temp 98.3°F | Resp 16 | Ht 72.0 in | Wt 170.0 lb

## 2017-01-09 DIAGNOSIS — M545 Low back pain: Secondary | ICD-10-CM | POA: Diagnosis not present

## 2017-01-09 DIAGNOSIS — M5116 Intervertebral disc disorders with radiculopathy, lumbar region: Secondary | ICD-10-CM | POA: Diagnosis not present

## 2017-01-09 DIAGNOSIS — Z87442 Personal history of urinary calculi: Secondary | ICD-10-CM | POA: Insufficient documentation

## 2017-01-09 DIAGNOSIS — R531 Weakness: Secondary | ICD-10-CM | POA: Diagnosis not present

## 2017-01-09 DIAGNOSIS — G4730 Sleep apnea, unspecified: Secondary | ICD-10-CM | POA: Diagnosis not present

## 2017-01-09 DIAGNOSIS — M5416 Radiculopathy, lumbar region: Secondary | ICD-10-CM

## 2017-01-09 DIAGNOSIS — Z79899 Other long term (current) drug therapy: Secondary | ICD-10-CM | POA: Insufficient documentation

## 2017-01-09 DIAGNOSIS — M5136 Other intervertebral disc degeneration, lumbar region: Secondary | ICD-10-CM

## 2017-01-09 DIAGNOSIS — G8929 Other chronic pain: Secondary | ICD-10-CM | POA: Diagnosis not present

## 2017-01-09 DIAGNOSIS — M9983 Other biomechanical lesions of lumbar region: Secondary | ICD-10-CM | POA: Diagnosis not present

## 2017-01-09 DIAGNOSIS — M48061 Spinal stenosis, lumbar region without neurogenic claudication: Secondary | ICD-10-CM

## 2017-01-09 DIAGNOSIS — M47816 Spondylosis without myelopathy or radiculopathy, lumbar region: Secondary | ICD-10-CM

## 2017-01-09 DIAGNOSIS — F329 Major depressive disorder, single episode, unspecified: Secondary | ICD-10-CM | POA: Diagnosis not present

## 2017-01-09 DIAGNOSIS — E785 Hyperlipidemia, unspecified: Secondary | ICD-10-CM | POA: Insufficient documentation

## 2017-01-09 NOTE — Progress Notes (Signed)
Safety precautions to be maintained throughout the outpatient stay will include: orient to surroundings, keep bed in low position, maintain call bell within reach at all times, provide assistance with transfer out of bed and ambulation.  

## 2017-01-09 NOTE — Progress Notes (Addendum)
nd then he is doing follow-up: Call: ThePatient's Name: Gregory Guzman  MRN: [id number]  Referring Provider: Madelyn Brunner, MD  DOB: 11/16/1970  PCP: Madelyn Brunner, MD  DOS: 01/09/2017  Note by: Gillis Santa, MD  Service setting: Ambulatory outpatient  Specialty: Interventional Pain Management  Location: [facility] (AMB) Pain Management Facility    Patient type: Established   Primary Reason(s) for Visit: Evaluation of chronic illnesses with exacerbation, or progression (Level of risk: moderate) CC: Back Pain (lower)  HPI  Gregory Guzman is a 46 y.o. year old, male patient, who comes today for a follow-up evaluation. He has Clinical depression; HLD (hyperlipidemia); and Apnea, sleep on his problem list. Gregory Guzman was last seen on 12/17/2016. His primarily concern today is the Back Pain (lower)  Pain Assessment: Location: Lower Back Radiating: n/a Onset: More than a month ago Duration: Chronic pain  Quality: Pressure, Constant, Sharp Severity: 4 /10 (self-reported pain score)  Note: Reported level is compatible with observation.                   When using our objective Pain Scale, levels between 6 and 10/10 are said to belong in an emergency room, as it progressively worsens from a 6/10, described as severely limiting, requiring emergency care not usually available at an outpatient pain management facility. At a 6/10 level, communication becomes difficult and requires great effort. Assistance to reach the emergency department may be required. Facial flushing and profuse sweating along with potentially dangerous increases in heart rate and blood pressure will be evident. Effect on ADL:   Timing: Constant Modifying factors: changing position   Gregory Guzman returns today after doing physical therapy. He states that his radicular pain is much improved since we did the epidural injections in physical therapy. He does not endorse any radiating pain into his legs. However he states that his pain is more  focal in his lumbosacral region and is very debilitating for him. He is having difficulty sleeping at night due to the pain and wakes up at night as a result of it. It is affecting his activities of daily living. He did have an episode last week where he had severe pain in his lumbosacral region and had to lay down until it resolved. Patient denies any bowel or bladder dysfunction. He states that bending exacerbates his pain.  He is interested in seeking a surgical consultation and I will refer him to Dr. Cari Caraway to discuss if surgery would be beneficial.  Further details on both, my assessment(s), as well as the proposed treatment plan, please see below.  Laboratory Chemistry  Inflammation Markers (CRP: Acute Phase) (ESR: Chronic Phase) No results found for: CRP, ESRSEDRATE               Renal Function Markers Lab Results  Component Value Date   BUN 12 02/23/2015   CREATININE 1.11 02/23/2015   GFRAA >60 02/23/2015   GFRNONAA >60 02/23/2015                 Hepatic Function Markers No results found for: AST, ALT, ALBUMIN, ALKPHOS, HCVAB               Electrolytes Lab Results  Component Value Date   NA 140 02/23/2015   K 4.7 02/23/2015   CL 107 02/23/2015   CALCIUM 8.8 (L) 02/23/2015                 Neuropathy Markers No results found  for: [id number]               Bone Pathology Markers Lab Results  Component Value Date   CALCIUM 8.8 (L) 02/23/2015                 Coagulation Parameters Lab Results  Component Value Date   PLT 248 02/23/2015                 Cardiovascular Markers Lab Results  Component Value Date   HGB 15.3 02/23/2015   HCT 45.6 02/23/2015                 Note: Lab results reviewed.  Recent Diagnostic Imaging Review    Lumbosacral Imaging: Lumbar MR wo contrast:  Results for orders placed during the hospital encounter of 10/17/16  MR LUMBAR SPINE WO CONTRAST   Narrative CLINICAL DATA:  Low back pain and right-sided leg pain  EXAM: MRI  LUMBAR SPINE WITHOUT CONTRAST  TECHNIQUE: Multiplanar, multisequence MR imaging of the lumbar spine was performed. No intravenous contrast was administered.  COMPARISON:  None.  FINDINGS: Segmentation:  Normal  Alignment:  Normal  Vertebrae: Modic type 2 discogenic endplate signal changes at L4-L5. No acute abnormality. No focal marrow lesion. No facet edema.  Conus medullaris: Extends to the L1 level and appears normal.  Paraspinal and other soft tissues: Normal.  Disc levels:  T12-L1: Normal disc space and facets. No spinal canal or neuroforaminal stenosis.  L1-L2: Normal disc space and facets. No spinal canal or neuroforaminal stenosis.  L2-L3: Normal disc space and facets. No spinal canal or neuroforaminal stenosis.  L3-L4: There is a left extraforaminal disc protrusion with annular fissure. This is in close proximity to the exiting left L3 nerve root but does not visibly contact the nerve. No spinal canal or neural foraminal stenosis.  L4-L5: Mild diffuse disc bulge. No spinal canal, neural foraminal or lateral recess stenosis. There is a small annular fissure in a left foraminal location.  L5-S1: Small central disc extrusion with superior migration of approximately 6 mm. This is not cause spinal canal or lateral recess stenosis. There is no neural foraminal stenosis.  Visualized sacrum: Normal.  IMPRESSION: 1. Left foraminal and extraforaminal disc protrusions at L4-5 and L3-4, respectively. No nerve root contact/impingement, but annular fissures at these locations could serve as a source of irritation to the exiting L3 and L4 nerve roots. 2. Small central disc extrusion with superior migration of 6 mm at L5-S1, without associated spinal canal or lateral recess stenosis. 3. No lumbar spinal canal stenosis.   Electronically Signed   By: Ulyses Jarred M.D.   On: 10/17/2016 13:59      Complexity Note: Imaging results reviewed. Results shared with Mr.  Guzman, using Layman's terms.                         Meds   Current Outpatient Prescriptions:  .  cetirizine (ZYRTEC) 10 MG tablet, Take 10 mg by mouth daily., Disp: , Rfl:  .  citalopram (CELEXA) 20 MG tablet, Take 40 mg by mouth every morning. , Disp: , Rfl:  .  EPINEPHrine (EPIPEN 2-PAK) 0.3 mg/0.3 mL IJ SOAJ injection, Reported on 04/14/2015, Disp: , Rfl:  .  losartan (COZAAR) 25 MG tablet, Take 25 mg by mouth daily., Disp: , Rfl:  .  pravastatin (PRAVACHOL) 20 MG tablet, TK 1 T PO NIGHTLY, Disp: , Rfl: 5  ROS  Constitutional: Denies any fever  or chills Gastrointestinal: No reported hemesis, hematochezia, vomiting, or acute GI distress Musculoskeletal: Denies any acute onset joint swelling, redness, loss of ROM, or weakness Neurological: No reported episodes of acute onset apraxia, aphasia, dysarthria, agnosia, amnesia, paralysis, loss of coordination, or loss of consciousness  Allergies  Mr. Frye has No Known Allergies.  PFSH  Drug: Mr. Yurko  reports that he does not use drugs. Alcohol:  reports that he drinks alcohol. Tobacco:  reports that he has never smoked. He has never used smokeless tobacco. Medical:  has a past medical history of Allergy; Chicken pox; Chronic kidney disease; Depression; Difficult intubation; Hematuria; HLD (hyperlipidemia); Kidney stone; Rosacea; and Sleep apnea. Surgical: Mr. Pavek  has a past surgical history that includes Back surgery; Nasal sinus surgery; Lithotripsy (N/A); Cystoscopy/ureteroscopy/holmium laser/stent placement (Right, 03/01/2015); Cystoscopy w/ retrogrades (Left, 03/01/2015); Ureteroscopy with holmium laser lithotripsy (Right, 03/08/2015); and Cystoscopy with stent placement (Right, 03/08/2015). Family: family history includes Heart disease in his father and paternal grandmother; Kidney Stones in his maternal grandmother and mother; Thyroid disease in his sister.  Constitutional Exam  General appearance: Well nourished, well developed, and  well hydrated. In no apparent acute distress Vitals:   01/09/17 0828  BP: (!) 143/98  Pulse: 70  Resp: 16  Temp: 98.3 F (36.8 C)  TempSrc: Oral  SpO2: 100%  Weight: 170 lb (77.1 kg)  Height: 6' (1.829 m)   BMI Assessment: Estimated body mass index is 23.06 kg/m as calculated from the following:   Height as of this encounter: 6' (1.829 m).   Weight as of this encounter: 170 lb (77.1 kg).  BMI interpretation table: BMI level Category Range association with higher incidence of chronic pain  <18 kg/m2 Underweight   18.5-24.9 kg/m2 Ideal body weight   25-29.9 kg/m2 Overweight Increased incidence by 20%  30-34.9 kg/m2 Obese (Class I) Increased incidence by 68%  35-39.9 kg/m2 Severe obesity (Class II) Increased incidence by 136%  >40 kg/m2 Extreme obesity (Class III) Increased incidence by 254%   BMI Readings from Last 4 Encounters:  01/09/17 23.06 kg/m  12/17/16 23.06 kg/m  12/04/16 23.06 kg/m  11/21/16 23.06 kg/m   Wt Readings from Last 4 Encounters:  01/09/17 170 lb (77.1 kg)  12/17/16 170 lb (77.1 kg)  12/04/16 170 lb (77.1 kg)  11/21/16 170 lb (77.1 kg)  Psych/Mental status: Alert, oriented x 3 (person, place, & time)       Eyes: PERLA Respiratory: No evidence of acute respiratory distress Cervical Spine Area Exam  Skin & Axial Inspection: No masses, redness, edema, swelling, or associated skin lesions Alignment: Symmetrical Functional ROM: Unrestricted ROM      Stability: No instability detected Muscle Tone/Strength: Functionally intact. No obvious neuro-muscular anomalies detected. Sensory (Neurological): Unimpaired Palpation: No palpable anomalies                     Upper Extremity (UE) Exam    Side: Right upper extremity  Side: Left upper extremity   Skin & Extremity Inspection: Skin color, temperature, and hair growth are WNL. No peripheral edema or cyanosis. No masses, redness, swelling, asymmetry, or associated skin lesions. No contractures.  Skin  & Extremity Inspection: Skin color, temperature, and hair growth are WNL. No peripheral edema or cyanosis. No masses, redness, swelling, asymmetry, or associated skin lesions. No contractures.   Functional ROM: Unrestricted ROM          Functional ROM: Unrestricted ROM           Muscle  Tone/Strength: Functionally intact. No obvious neuro-muscular anomalies detected.  Muscle Tone/Strength: Functionally intact. No obvious neuro-muscular anomalies detected.   Sensory (Neurological): Unimpaired          Sensory (Neurological): Unimpaired           Palpation: No palpable anomalies              Palpation: No palpable anomalies               Specialized Test(s): Deferred         Specialized Test(s): Deferred           Thoracic Spine Area Exam  Skin & Axial Inspection: No masses, redness, or swelling Alignment: Symmetrical Functional ROM: Unrestricted ROM Stability: No instability detected Muscle Tone/Strength: Functionally intact. No obvious neuro-muscular anomalies detected. Sensory (Neurological): Unimpaired Muscle strength & Tone: No palpable anomalies  Lumbar Spine Area Exam  Skin & Axial Inspection: No masses, redness, or swelling Alignment: Symmetrical Functional ROM: Unrestricted ROM      Stability: No instability detected Muscle Tone/Strength: Functionally intact. No obvious neuro-muscular anomalies detected. Sensory (Neurological): Unimpaired Palpation: No palpable anomalies       Provocative Tests: Lumbar Hyperextension and rotation test: Improved after treatment       Lumbar Lateral bending test: Improved after treatment       Patrick's Maneuver: evaluation deferred today                    Gait & Posture Assessment  Ambulation: Unassisted Gait: Relatively normal for age and body habitus Posture: WNL   Lower Extremity Exam    Side: Right lower extremity  Side: Left lower extremity  Skin & Extremity Inspection: Skin color, temperature, and hair growth are WNL. No  peripheral edema or cyanosis. No masses, redness, swelling, asymmetry, or associated skin lesions. No contractures.  Skin & Extremity Inspection: Skin color, temperature, and hair growth are WNL. No peripheral edema or cyanosis. No masses, redness, swelling, asymmetry, or associated skin lesions. No contractures.  Functional ROM: Unrestricted ROM          Functional ROM: Unrestricted ROM          Muscle Tone/Strength: Functionally intact. No obvious neuro-muscular anomalies detected.  Muscle Tone/Strength: Functionally intact. No obvious neuro-muscular anomalies detected.  Sensory (Neurological): Unimpaired  Sensory (Neurological): Unimpaired  Palpation: No palpable anomalies  Palpation: No palpable anomalies    Assessment  Primary Diagnosis & Pertinent Problem List: The primary encounter diagnosis was Neuroforaminal stenosis of lumbar spine. Diagnoses of Lumbosacral pain, chronic, Lumbar degenerative disc disease, and Lumbar radiculopathy were also pertinent to this visit.  Status Diagnosis  Not responding Worsening Stable 1. Neuroforaminal stenosis of lumbar spine   2. Lumbosacral pain, chronic   3. Lumbar degenerative disc disease   4. Lumbar radiculopathy       Patient is a 46 year old male with a past medical history of lumbar microdiscectomy at L4-L5 approximately 27 years ago secondary to a herniated disc from a lifting accident. Patient's MRI shows left foraminal and foraminal disc protrusions at L4-L5 and L3-L4. He also has a small central disc extrusion with superior migration of 6 mm at the L5-S1 without any spinal canal or lateral recess stenosis. Patient does not have any central lumbar spinal canal stenosis. Patient is status post lumbar epidural steroid injection on August 15 and then subsequently on September 12 with moderate to significant relief of his radicular pain symptoms. He feels like his sharp shooting pain down into his right  leg is not as constant and severe as  before. Patient has also completed physical therapy.   He is endorsing focal right lumbosacral pain that is intermittent yet severe. He finds this pain very debilitating. At times the patient does experience weakness and has to rest. No bowel or bladder dysfunction Lumbar epidural steroid injections along with physical therapy have provided moderate to significant relief of his right radicular symptoms as the patient states that he has no more radiating pain into his lower extremities however endorses severe focal lumbosacral pain and is interested in surgical consultation. I have recommended that he follow up with Dr. Cari Caraway to discuss if surgery would be beneficial for his focal lumbosacral pain in the context of having prior L4-L5 lumbar microdiscectomy.I did have a discussion with the patient about spinal cord stimulation and given that his pain is primarily axial low back rather than neuropathic appendicular, spinal cord stimulation may not be that beneficial.   Plan: -Follow up with Dr. Cari Caraway regarding surgical options -Follow up with me as needed.  Addendum 01/13/2017: -Patient was seen by neurosurgery and was not deemed a surgical candidate. Discussion with Dr. Cari Caraway regarding his persistent low back pain which could be secondary to lumbar spondylosis. We discussed potentially trying lumbar diagnostic facet blocks for his focal axial lumbosacral pain. Patient does have pain with lumbar extension and lateral rotation.  -We'll plan for diagnostic bilateral lumbar facets: L3/L4, L4/L5, L5/S1   Provider-requested follow-up: Return if symptoms worsen or fail to improve.  No future appointments.  Primary Care Physician: Madelyn Brunner, MD Location: [facility] Note by: Gillis Santa, M.D Date: 01/09/2017; Time: 9:00 AM  There are no Patient Instructions on file for this visit.

## 2017-01-13 NOTE — Addendum Note (Signed)
Addended by: Edward JollyLATEEF, Lennon Richins on: 01/13/2017 10:17 AM   Modules accepted: Orders

## 2017-01-20 ENCOUNTER — Ambulatory Visit: Payer: 59 | Admitting: Student in an Organized Health Care Education/Training Program

## 2017-01-24 DIAGNOSIS — Z7689 Persons encountering health services in other specified circumstances: Secondary | ICD-10-CM | POA: Diagnosis not present

## 2017-01-24 DIAGNOSIS — G4733 Obstructive sleep apnea (adult) (pediatric): Secondary | ICD-10-CM | POA: Diagnosis not present

## 2017-01-24 DIAGNOSIS — I10 Essential (primary) hypertension: Secondary | ICD-10-CM | POA: Diagnosis not present

## 2017-01-31 DIAGNOSIS — G4733 Obstructive sleep apnea (adult) (pediatric): Secondary | ICD-10-CM | POA: Diagnosis not present

## 2017-02-07 DIAGNOSIS — J301 Allergic rhinitis due to pollen: Secondary | ICD-10-CM | POA: Diagnosis not present

## 2017-02-10 DIAGNOSIS — J301 Allergic rhinitis due to pollen: Secondary | ICD-10-CM | POA: Diagnosis not present

## 2017-02-18 ENCOUNTER — Ambulatory Visit: Payer: 59 | Admitting: Student in an Organized Health Care Education/Training Program

## 2017-02-21 DIAGNOSIS — M549 Dorsalgia, unspecified: Secondary | ICD-10-CM | POA: Diagnosis not present

## 2017-02-21 DIAGNOSIS — M5416 Radiculopathy, lumbar region: Secondary | ICD-10-CM | POA: Diagnosis not present

## 2017-02-21 DIAGNOSIS — M546 Pain in thoracic spine: Secondary | ICD-10-CM | POA: Diagnosis not present

## 2017-02-21 DIAGNOSIS — M5136 Other intervertebral disc degeneration, lumbar region: Secondary | ICD-10-CM | POA: Diagnosis not present

## 2017-02-21 DIAGNOSIS — M47816 Spondylosis without myelopathy or radiculopathy, lumbar region: Secondary | ICD-10-CM | POA: Diagnosis not present

## 2017-04-21 DIAGNOSIS — B354 Tinea corporis: Secondary | ICD-10-CM | POA: Diagnosis not present

## 2017-04-21 DIAGNOSIS — L249 Irritant contact dermatitis, unspecified cause: Secondary | ICD-10-CM | POA: Diagnosis not present

## 2017-05-14 DIAGNOSIS — J309 Allergic rhinitis, unspecified: Secondary | ICD-10-CM | POA: Diagnosis not present

## 2017-05-16 DIAGNOSIS — J301 Allergic rhinitis due to pollen: Secondary | ICD-10-CM | POA: Diagnosis not present

## 2017-05-22 DIAGNOSIS — J301 Allergic rhinitis due to pollen: Secondary | ICD-10-CM | POA: Diagnosis not present

## 2017-06-19 DIAGNOSIS — I10 Essential (primary) hypertension: Secondary | ICD-10-CM | POA: Diagnosis not present

## 2017-06-19 DIAGNOSIS — G4733 Obstructive sleep apnea (adult) (pediatric): Secondary | ICD-10-CM | POA: Diagnosis not present

## 2017-06-19 DIAGNOSIS — E7800 Pure hypercholesterolemia, unspecified: Secondary | ICD-10-CM | POA: Diagnosis not present

## 2017-07-17 DIAGNOSIS — I10 Essential (primary) hypertension: Secondary | ICD-10-CM | POA: Diagnosis not present

## 2017-07-17 DIAGNOSIS — Z125 Encounter for screening for malignant neoplasm of prostate: Secondary | ICD-10-CM | POA: Diagnosis not present

## 2017-07-17 DIAGNOSIS — G4733 Obstructive sleep apnea (adult) (pediatric): Secondary | ICD-10-CM | POA: Diagnosis not present

## 2017-07-24 DIAGNOSIS — I10 Essential (primary) hypertension: Secondary | ICD-10-CM | POA: Diagnosis not present

## 2017-07-24 DIAGNOSIS — G8929 Other chronic pain: Secondary | ICD-10-CM | POA: Diagnosis not present

## 2017-07-24 DIAGNOSIS — M25511 Pain in right shoulder: Secondary | ICD-10-CM | POA: Diagnosis not present

## 2017-07-24 DIAGNOSIS — M25551 Pain in right hip: Secondary | ICD-10-CM | POA: Diagnosis not present

## 2017-09-05 DIAGNOSIS — J301 Allergic rhinitis due to pollen: Secondary | ICD-10-CM | POA: Diagnosis not present

## 2017-09-11 DIAGNOSIS — J301 Allergic rhinitis due to pollen: Secondary | ICD-10-CM | POA: Diagnosis not present

## 2017-12-12 DIAGNOSIS — J301 Allergic rhinitis due to pollen: Secondary | ICD-10-CM | POA: Diagnosis not present

## 2017-12-15 DIAGNOSIS — J301 Allergic rhinitis due to pollen: Secondary | ICD-10-CM | POA: Diagnosis not present

## 2018-01-28 DIAGNOSIS — I10 Essential (primary) hypertension: Secondary | ICD-10-CM | POA: Diagnosis not present

## 2018-01-28 DIAGNOSIS — E7800 Pure hypercholesterolemia, unspecified: Secondary | ICD-10-CM | POA: Diagnosis not present

## 2018-01-28 DIAGNOSIS — Z0000 Encounter for general adult medical examination without abnormal findings: Secondary | ICD-10-CM | POA: Diagnosis not present

## 2018-03-31 DIAGNOSIS — J301 Allergic rhinitis due to pollen: Secondary | ICD-10-CM | POA: Diagnosis not present

## 2018-04-07 DIAGNOSIS — J301 Allergic rhinitis due to pollen: Secondary | ICD-10-CM | POA: Diagnosis not present

## 2018-04-16 DIAGNOSIS — T7840XA Allergy, unspecified, initial encounter: Secondary | ICD-10-CM | POA: Diagnosis not present

## 2018-04-16 DIAGNOSIS — J309 Allergic rhinitis, unspecified: Secondary | ICD-10-CM | POA: Diagnosis not present

## 2018-05-05 DIAGNOSIS — J301 Allergic rhinitis due to pollen: Secondary | ICD-10-CM | POA: Diagnosis not present

## 2018-06-14 ENCOUNTER — Emergency Department
Admission: EM | Admit: 2018-06-14 | Discharge: 2018-06-14 | Disposition: A | Discharge status: Home / Self Care | Payer: 59 | Attending: Emergency Medicine | Admitting: Emergency Medicine

## 2018-06-14 ENCOUNTER — Other Ambulatory Visit: Payer: Self-pay

## 2018-06-14 ENCOUNTER — Emergency Department: Payer: 59

## 2018-06-14 DIAGNOSIS — Z79899 Other long term (current) drug therapy: Secondary | ICD-10-CM | POA: Diagnosis not present

## 2018-06-14 DIAGNOSIS — N189 Chronic kidney disease, unspecified: Secondary | ICD-10-CM | POA: Insufficient documentation

## 2018-06-14 DIAGNOSIS — R079 Chest pain, unspecified: Secondary | ICD-10-CM | POA: Diagnosis not present

## 2018-06-14 DIAGNOSIS — R0789 Other chest pain: Secondary | ICD-10-CM | POA: Insufficient documentation

## 2018-06-14 LAB — CBC
HCT: 44.7 % (ref 39.0–52.0)
Hemoglobin: 15.5 g/dL (ref 13.0–17.0)
MCH: 30.9 pg (ref 26.0–34.0)
MCHC: 34.7 g/dL (ref 30.0–36.0)
MCV: 89.2 fL (ref 80.0–100.0)
Platelets: 296 10*3/uL (ref 150–400)
RBC: 5.01 MIL/uL (ref 4.22–5.81)
RDW: 11.9 % (ref 11.5–15.5)
WBC: 7.2 10*3/uL (ref 4.0–10.5)
nRBC: 0 % (ref 0.0–0.2)

## 2018-06-14 LAB — BASIC METABOLIC PANEL
Anion gap: 7 (ref 5–15)
BUN: 18 mg/dL (ref 6–20)
CHLORIDE: 103 mmol/L (ref 98–111)
CO2: 25 mmol/L (ref 22–32)
Calcium: 8.6 mg/dL — ABNORMAL LOW (ref 8.9–10.3)
Creatinine, Ser: 1.05 mg/dL (ref 0.61–1.24)
GFR calc Af Amer: >60 mL/min (ref >60)
GFR calc non Af Amer: >60 mL/min (ref >60)
Glucose, Bld: 102 mg/dL — ABNORMAL HIGH (ref 70–99)
Potassium: 4 mmol/L (ref 3.5–5.1)
Sodium: 135 mmol/L (ref 135–145)

## 2018-06-14 LAB — TROPONIN I
Troponin I: <0.03 ng/mL (ref <0.03)
Troponin I: <0.03 ng/mL (ref <0.03)

## 2018-06-14 MED ORDER — KETOROLAC TROMETHAMINE 30 MG/ML IJ SOLN
15.0000 mg | Freq: Once | INTRAMUSCULAR | Status: AC
  Administered 2018-06-14: 15 mg via INTRAVENOUS
  Filled 2018-06-14: qty 1

## 2018-06-14 NOTE — ED Triage Notes (Signed)
PT to ED via POV from home.  Pt woke up at 230 with sharp pain in right shoulder/chest, pt then became flushed, light headed and had an episode of vomiting. Pt states episode of hypertension as well. HX of panic attacks. Pt did lots of painting today with right hand.

## 2018-06-14 NOTE — ED Provider Notes (Signed)
That to me this morning at 730 with atypical chest pain, according to signout, if second troponin is negative patient is to be discharged.  Patient has no ongoing symptoms or complaints he does not want to be admitted, return/ follow-up given and understood.  We will therefore discharge the patient per prior physician's plan.   Jeanmarie Plant, MD 06/14/18 [phone]

## 2018-06-14 NOTE — ED Provider Notes (Signed)
[facility] Emergency Department Provider Note   ____________________________________________   First MD Initiated Contact with Patient 06/14/18 0423     (approximate)  I have reviewed the triage vital signs and the nursing notes.   HISTORY  Chief Complaint Chest Pain    HPI TORRIAN CANION is a 48 y.o. male who presents to the ED from home with a chief complaint of right-sided sharp chest pain.  Patient is right-hand dominant and states he was outside painting a lot yesterday.  Awoke approximately 2:30 AM with sharp pain in his right upper chest/shoulder.  He became flushed, lightheaded and had one episode of vomiting.  Checked his blood pressure and states he was higher than usual.  States chest/shoulder pain is exacerbated by movement.  Denies recent fever, chills, shortness of breath, abdominal pain, diarrhea.  Denies recent travel, trauma or exposure to known coronavirus.       Past Medical History:  Diagnosis Date  . Allergy   . Chicken pox   . Chronic kidney disease   . Depression   . Difficult intubation   . Hematuria   . HLD (hyperlipidemia)   . Kidney stone   . Rosacea   . Sleep apnea     Patient Active Problem List   Diagnosis Date Noted  . HLD (hyperlipidemia) 06/01/2014  . Clinical depression 12/01/2013  . Apnea, sleep 12/01/2013    Past Surgical History:  Procedure Laterality Date  . BACK SURGERY    . CYSTOSCOPY W/ RETROGRADES Left 03/01/2015   Procedure: CYSTOSCOPY WITH RETROGRADE PYELOGRAM;  Surgeon: Vanna Scotland, MD;  Location: [facility] ORS;  Service: Urology;  Laterality: Left;  . CYSTOSCOPY WITH STENT PLACEMENT Right 03/08/2015   Procedure: CYSTOSCOPY WITH   STENT PLACEMENT;  Surgeon: Vanna Scotland, MD;  Location: [facility] OR[facility];  Service: Urology;  Laterality: Right;  . CYSTOSCOPY/URETEROSCOPY/HOLMIUM LASER/STENT PLACEMENT Right 03/01/2015   Procedure: CYSTOSCOPY/URETEROSCOPY;  Surgeon: Vanna Scotland, MD;  Location: [facility] OR[facility];   Service: Urology;  Laterality: Right;  . LITHOTRIPSY N/A   . NASAL SINUS SURGERY     x2  . URETEROSCOPY WITH HOLMIUM LASER LITHOTRIPSY Right 03/08/2015   Procedure: URETEROSCOPY WITH HOLMIUM LASER LITHOTRIPSY AND RETROGRADE ZOXWRUEA;  Surgeon: Vanna Scotland, MD;  Location: [facility] OR[facility];  Service: Urology;  Laterality: Right;    Prior to Admission medications   Medication Sig Start Date End Date Taking? Authorizing Provider  cetirizine (ZYRTEC) 10 MG tablet Take 10 mg by mouth daily.    [provider]  citalopram (CELEXA) 20 MG tablet Take 40 mg by mouth every morning.  01/02/15   [provider]  EPINEPHrine (EPIPEN 2-PAK) 0.3 mg/0.3 mL IJ SOAJ injection Reported on 04/14/2015 11/08/14   [provider]  losartan (COZAAR) 25 MG tablet Take 25 mg by mouth daily.    [provider]  pravastatin (PRAVACHOL) 20 MG tablet TK 1 T PO NIGHTLY 01/25/15   [provider]    Allergies Patient has no known allergies.  Family History  Problem Relation Age of Onset  . Kidney Stones Mother   . Heart disease Father   . Kidney Stones Maternal Grandmother   . Heart disease Paternal Grandmother   . Thyroid disease Sister     Social History Social History   Tobacco Use  . Smoking status: Never Smoker  . Smokeless tobacco: Never Used  Substance Use Topics  . Alcohol use: Yes    Alcohol/week: 15.0 standard drinks    Types: 15 Standard drinks  or equivalent per week    Comment: 15 drinks per week  . Drug use: No    Review of Systems  Constitutional: No fever/chills Eyes: No visual changes. ENT: No sore throat. Cardiovascular: Positive for chest pain. Respiratory: Denies shortness of breath. Gastrointestinal: No abdominal pain.  Positive for 1 episode of vomiting.  No diarrhea.  No constipation. Genitourinary: Negative for dysuria. Musculoskeletal: Negative for back pain. Skin: Negative for rash. Neurological: Negative for headaches, focal weakness  or numbness.   ____________________________________________   PHYSICAL EXAM:  VITAL SIGNS: ED Triage Vitals  Enc Vitals Group     BP 06/14/18 0404 (!) 143/110     Pulse Rate 06/14/18 0404 84     Resp 06/14/18 0404 16     Temp 06/14/18 0404 97.8 F (36.6 C)     Temp Source 06/14/18 0404 Oral     SpO2 06/14/18 0404 97 %     Weight 06/14/18 0400 175 lb (79.4 kg)     Height 06/14/18 0400 6' (1.829 m)     Head Circumference --      Peak Flow --      Pain Score 06/14/18 0359 4     Pain Loc --      Pain Edu? --      Excl. in GC? --     Constitutional: Alert and oriented. Well appearing and in no acute distress. Eyes: Conjunctivae are normal. PERRL. EOMI. Head: Atraumatic.  Face is slightly flushed. Nose: No congestion/rhinnorhea. Mouth/Throat: Mucous membranes are moist.  Oropharynx non-erythematous. Neck: No stridor.   Cardiovascular: Normal rate, regular rhythm. Grossly normal heart sounds.  Good peripheral circulation. Respiratory: Normal respiratory effort.  No retractions. Lungs CTAB.  Right upper anterior chest mildly tender to palpation. Gastrointestinal: Soft and nontender. No distention. No abdominal bruits. No CVA tenderness. Musculoskeletal: No lower extremity tenderness nor edema.  No joint effusions. Neurologic:  Normal speech and language. No gross focal neurologic deficits are appreciated. No gait instability. Skin:  Skin is warm, dry and intact. No rash noted. Psychiatric: Mood and affect are normal. Speech and behavior are normal.  ____________________________________________   LABS (all labs ordered are listed, but only abnormal results are displayed)  Labs Reviewed  BASIC METABOLIC PANEL - Abnormal; Notable for the following components:      Result Value   Glucose, Bld 102 (*)    Calcium 8.6 (*)    All other components within normal limits  CBC  TROPONIN I  TROPONIN I   ____________________________________________  EKG  ED ECG REPORT I,  Everlyn Farabaugh J, the attending physician, personally viewed and interpreted this ECG.   Date: 06/14/2018  EKG Time: 0401  Rate: 84  Rhythm: normal EKG, normal sinus rhythm  Axis: Normal  Intervals:none  ST&T Change: Nonspecific  ____________________________________________  RADIOLOGY  ED MD interpretation: No active cardiopulmonary process  Official radiology report(s): Dg Chest 2 View  Result Date: 06/14/2018 CLINICAL DATA:  48 year old male with history of sharp right-sided shoulder and chest pain. EXAM: CHEST - 2 VIEW COMPARISON:  No priors. FINDINGS: Lung volumes are normal. No consolidative airspace disease. No pleural effusions. No pneumothorax. No pulmonary nodule or mass noted. Pulmonary vasculature and the cardiomediastinal silhouette are within normal limits. IMPRESSION: No radiographic evidence of acute cardiopulmonary disease. Electronically Signed   By: Trudie Reed M.D.   On: 06/14/2018 05:26    ____________________________________________   PROCEDURES  Procedure(s) performed (including Critical Care):  Procedures   ____________________________________________   INITIAL IMPRESSION / ASSESSMENT  AND PLAN / ED COURSE  As part of my medical decision making, I reviewed the following data within the electronic MEDICAL RECORD NUMBER History obtained from family, Nursing notes reviewed and incorporated, Labs reviewed, EKG interpreted, Old chart reviewed, Radiograph reviewed and Notes from prior ED visits        48 year old male who presents with sharp right-sided chest pain. Differential diagnosis includes, but is not limited to, ACS, aortic dissection, pulmonary embolism, cardiac tamponade, pneumothorax, pneumonia, pericarditis, myocarditis, GI-related causes including esophagitis/gastritis, and musculoskeletal chest wall pain.    Will obtain cardiac work-up, EKG and chest x-ray.  Currently pain is 2/10.  I personally reviewed patient's chart and see he had a visit with  Dr. Lady Gary from cardiology on 06/19/2017.  In that note cardiology mention patient had a normal exercise stress test.    Clinical Course as of Jun 13 648  Sun Jun 14, 2018  0619 Patient is feeling significantly better after IV Toradol.  Updated him on all test results.  He is agreeable to the plan for timed troponin at 7 AM and discharge home if troponin remains normal.   [JS]  0650 Care will be transferred to the oncoming physician for repeat troponin.   [JS]    Clinical Course User Index [JS] Irean Hong, MD     ____________________________________________   FINAL CLINICAL IMPRESSION(S) / ED DIAGNOSES  Final diagnoses:  Nonspecific chest pain  Chest wall pain     ED Discharge Orders    None       Note:  This document was prepared using Dragon voice recognition software and may include unintentional dictation errors.   Irean Hong, MD 06/14/18 [phone]

## 2018-06-14 NOTE — ED Triage Notes (Signed)
Pt says he woke about 0230 with sharp pain in right upper chest/shoulder area; says face was flushed and he vomited once; pt denies cardiac issues; history of HTN and takes medication for same;

## 2018-06-14 NOTE — Discharge Instructions (Signed)
Continue all medicines as directed by your doctor.  Take your blood pressure morning, noon and evening and record these to take to your doctor.  Return to the ER for recurrent or worsening symptoms, persistent vomiting, difficulty breathing or other concerns.

## 2018-06-23 DIAGNOSIS — I10 Essential (primary) hypertension: Secondary | ICD-10-CM | POA: Diagnosis not present

## 2018-06-23 DIAGNOSIS — E782 Mixed hyperlipidemia: Secondary | ICD-10-CM | POA: Diagnosis not present

## 2018-06-23 DIAGNOSIS — G4733 Obstructive sleep apnea (adult) (pediatric): Secondary | ICD-10-CM | POA: Diagnosis not present

## 2018-07-14 DIAGNOSIS — J301 Allergic rhinitis due to pollen: Secondary | ICD-10-CM | POA: Diagnosis not present

## 2018-07-22 DIAGNOSIS — I10 Essential (primary) hypertension: Secondary | ICD-10-CM | POA: Diagnosis not present

## 2018-07-22 DIAGNOSIS — E7800 Pure hypercholesterolemia, unspecified: Secondary | ICD-10-CM | POA: Diagnosis not present

## 2018-07-22 DIAGNOSIS — Z0000 Encounter for general adult medical examination without abnormal findings: Secondary | ICD-10-CM | POA: Diagnosis not present

## 2018-07-22 DIAGNOSIS — Z125 Encounter for screening for malignant neoplasm of prostate: Secondary | ICD-10-CM | POA: Diagnosis not present

## 2018-07-29 DIAGNOSIS — I10 Essential (primary) hypertension: Secondary | ICD-10-CM | POA: Diagnosis not present

## 2018-07-29 DIAGNOSIS — E782 Mixed hyperlipidemia: Secondary | ICD-10-CM | POA: Diagnosis not present

## 2018-09-10 ENCOUNTER — Other Ambulatory Visit: Payer: Self-pay | Admitting: Student

## 2018-09-10 DIAGNOSIS — M5441 Lumbago with sciatica, right side: Secondary | ICD-10-CM

## 2018-09-30 ENCOUNTER — Other Ambulatory Visit: Payer: Self-pay

## 2018-09-30 ENCOUNTER — Ambulatory Visit
Admission: RE | Admit: 2018-09-30 | Discharge: 2018-09-30 | Disposition: A | Discharge status: Home / Self Care | Payer: 59 | Source: Ambulatory Visit | Attending: Student | Admitting: Student

## 2018-09-30 DIAGNOSIS — M5441 Lumbago with sciatica, right side: Secondary | ICD-10-CM

## 2019-06-15 ENCOUNTER — Other Ambulatory Visit: Payer: Self-pay

## 2019-06-15 ENCOUNTER — Other Ambulatory Visit
Admission: RE | Admit: 2019-06-15 | Discharge: 2019-06-15 | Disposition: A | Discharge status: Home / Self Care | Payer: 59 | Source: Ambulatory Visit | Attending: Internal Medicine | Admitting: Internal Medicine

## 2019-06-15 DIAGNOSIS — Z01812 Encounter for preprocedural laboratory examination: Secondary | ICD-10-CM | POA: Diagnosis not present

## 2019-06-15 DIAGNOSIS — Z20822 Contact with and (suspected) exposure to covid-19: Secondary | ICD-10-CM | POA: Insufficient documentation

## 2019-06-15 LAB — SARS CORONAVIRUS 2 (TAT 6-24 HRS): SARS Coronavirus 2: NEGATIVE

## 2019-10-08 ENCOUNTER — Other Ambulatory Visit: Payer: Self-pay

## 2019-10-08 ENCOUNTER — Encounter: Payer: Self-pay | Admitting: Emergency Medicine

## 2019-10-08 ENCOUNTER — Emergency Department: Payer: 59

## 2019-10-08 ENCOUNTER — Emergency Department
Admission: EM | Admit: 2019-10-08 | Discharge: 2019-10-08 | Disposition: A | Discharge status: Home / Self Care | Payer: 59 | Attending: Emergency Medicine | Admitting: Emergency Medicine

## 2019-10-08 DIAGNOSIS — Y92410 Unspecified street and highway as the place of occurrence of the external cause: Secondary | ICD-10-CM | POA: Diagnosis not present

## 2019-10-08 DIAGNOSIS — M7918 Myalgia, other site: Secondary | ICD-10-CM

## 2019-10-08 DIAGNOSIS — Z041 Encounter for examination and observation following transport accident: Secondary | ICD-10-CM | POA: Diagnosis not present

## 2019-10-08 DIAGNOSIS — Y999 Unspecified external cause status: Secondary | ICD-10-CM | POA: Insufficient documentation

## 2019-10-08 DIAGNOSIS — S161XXA Strain of muscle, fascia and tendon at neck level, initial encounter: Secondary | ICD-10-CM | POA: Insufficient documentation

## 2019-10-08 DIAGNOSIS — M791 Myalgia, unspecified site: Secondary | ICD-10-CM | POA: Insufficient documentation

## 2019-10-08 DIAGNOSIS — R519 Headache, unspecified: Secondary | ICD-10-CM | POA: Diagnosis not present

## 2019-10-08 DIAGNOSIS — Z955 Presence of coronary angioplasty implant and graft: Secondary | ICD-10-CM | POA: Insufficient documentation

## 2019-10-08 DIAGNOSIS — N189 Chronic kidney disease, unspecified: Secondary | ICD-10-CM | POA: Diagnosis not present

## 2019-10-08 DIAGNOSIS — Y9389 Activity, other specified: Secondary | ICD-10-CM | POA: Diagnosis not present

## 2019-10-08 DIAGNOSIS — S199XXA Unspecified injury of neck, initial encounter: Secondary | ICD-10-CM | POA: Diagnosis present

## 2019-10-08 MED ORDER — CYCLOBENZAPRINE HCL 10 MG PO TABS: 10.0000 mg | ORAL_TABLET | Freq: Three times a day (TID) | ORAL | 0 refills | Status: DC | PRN

## 2019-10-08 MED ORDER — CYCLOBENZAPRINE HCL 10 MG PO TABS
10.0000 mg | ORAL_TABLET | Freq: Once | ORAL | Status: AC
  Administered 2019-10-08: 10 mg via ORAL
  Filled 2019-10-08: qty 1

## 2019-10-08 MED ORDER — OXYCODONE-ACETAMINOPHEN 7.5-325 MG PO TABS: 1.0000 | ORAL_TABLET | Freq: Four times a day (QID) | ORAL | 0 refills | Status: AC | PRN

## 2019-10-08 MED ORDER — OXYCODONE-ACETAMINOPHEN 5-325 MG PO TABS
1.0000 | ORAL_TABLET | Freq: Once | ORAL | Status: AC
  Administered 2019-10-08: 1 via ORAL
  Filled 2019-10-08: qty 1

## 2019-10-08 MED ORDER — IBUPROFEN 600 MG PO TABS: 600.0000 mg | ORAL_TABLET | Freq: Three times a day (TID) | ORAL | 0 refills | Status: DC | PRN

## 2019-10-08 MED ORDER — IBUPROFEN 600 MG PO TABS
600.0000 mg | ORAL_TABLET | Freq: Once | ORAL | Status: AC
  Administered 2019-10-08: 600 mg via ORAL
  Filled 2019-10-08: qty 1
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
## 2019-10-08 NOTE — Discharge Instructions (Signed)
Follow discharge care instruction take medication as directed. °
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
## 2020-02-03 ENCOUNTER — Other Ambulatory Visit: Payer: Self-pay | Admitting: Internal Medicine

## 2020-02-03 ENCOUNTER — Other Ambulatory Visit (HOSPITAL_COMMUNITY): Payer: Self-pay | Admitting: Internal Medicine

## 2020-02-03 DIAGNOSIS — G8929 Other chronic pain: Secondary | ICD-10-CM

## 2020-02-21 ENCOUNTER — Other Ambulatory Visit: Payer: Self-pay

## 2020-02-21 ENCOUNTER — Ambulatory Visit
Admission: RE | Admit: 2020-02-21 | Discharge: 2020-02-21 | Disposition: A | Discharge status: Home / Self Care | Payer: 59 | Source: Ambulatory Visit | Attending: Internal Medicine | Admitting: Internal Medicine

## 2020-02-21 DIAGNOSIS — G8929 Other chronic pain: Secondary | ICD-10-CM | POA: Diagnosis present

## 2020-02-21 DIAGNOSIS — M25512 Pain in left shoulder: Secondary | ICD-10-CM | POA: Diagnosis present

## 2020-02-21 MED ORDER — GADOBUTROL 1 MMOL/ML IV SOLN
7.0000 mL | Freq: Once | INTRAVENOUS | Status: AC | PRN
  Administered 2020-02-21: 7 mL via INTRAVENOUS

## 2020-09-21 ENCOUNTER — Emergency Department
Admission: EM | Admit: 2020-09-21 | Discharge: 2020-09-21 | Disposition: A | Discharge status: Home / Self Care | Payer: 59 | Attending: Emergency Medicine | Admitting: Emergency Medicine

## 2020-09-21 ENCOUNTER — Other Ambulatory Visit: Payer: Self-pay

## 2020-09-21 ENCOUNTER — Emergency Department: Payer: 59

## 2020-09-21 DIAGNOSIS — N132 Hydronephrosis with renal and ureteral calculous obstruction: Secondary | ICD-10-CM | POA: Insufficient documentation

## 2020-09-21 DIAGNOSIS — R109 Unspecified abdominal pain: Secondary | ICD-10-CM | POA: Diagnosis present

## 2020-09-21 DIAGNOSIS — N189 Chronic kidney disease, unspecified: Secondary | ICD-10-CM | POA: Insufficient documentation

## 2020-09-21 DIAGNOSIS — N200 Calculus of kidney: Secondary | ICD-10-CM

## 2020-09-21 LAB — URINALYSIS, ROUTINE W REFLEX MICROSCOPIC
Bilirubin Urine: NEGATIVE
Glucose, UA: NEGATIVE mg/dL
Ketones, ur: NEGATIVE mg/dL
Leukocytes,Ua: NEGATIVE
Nitrite: NEGATIVE
Protein, ur: NEGATIVE mg/dL
RBC / HPF: >50 RBC/hpf — ABNORMAL HIGH (ref 0–5)
Specific Gravity, Urine: 1.025 (ref 1.005–1.030)
Squamous Epithelial / HPF: NONE SEEN (ref 0–5)
pH: 5 (ref 5.0–8.0)

## 2020-09-21 LAB — COMPREHENSIVE METABOLIC PANEL
ALT: 19 U/L (ref 0–44)
AST: 24 U/L (ref 15–41)
Albumin: 3.4 g/dL — ABNORMAL LOW (ref 3.5–5.0)
Alkaline Phosphatase: 64 U/L (ref 38–126)
Anion gap: 8 (ref 5–15)
BUN: 22 mg/dL — ABNORMAL HIGH (ref 6–20)
CO2: 27 mmol/L (ref 22–32)
Calcium: 8.6 mg/dL — ABNORMAL LOW (ref 8.9–10.3)
Chloride: 100 mmol/L (ref 98–111)
Creatinine, Ser: 1.15 mg/dL (ref 0.61–1.24)
GFR, Estimated: >60 mL/min (ref >60)
Glucose, Bld: 113 mg/dL — ABNORMAL HIGH (ref 70–99)
Potassium: 4.3 mmol/L (ref 3.5–5.1)
Sodium: 135 mmol/L (ref 135–145)
Total Bilirubin: 0.9 mg/dL (ref 0.3–1.2)
Total Protein: 6.7 g/dL (ref 6.5–8.1)

## 2020-09-21 LAB — CBC WITH DIFFERENTIAL/PLATELET
Abs Immature Granulocytes: 0.02 10*3/uL (ref 0.00–0.07)
Basophils Absolute: 0 10*3/uL (ref 0.0–0.1)
Basophils Relative: 1 %
Eosinophils Absolute: 0.2 10*3/uL (ref 0.0–0.5)
Eosinophils Relative: 3 %
HCT: 45.6 % (ref 39.0–52.0)
Hemoglobin: 16.1 g/dL (ref 13.0–17.0)
Immature Granulocytes: 0 %
Lymphocytes Relative: 33 %
Lymphs Abs: 2.2 10*3/uL (ref 0.7–4.0)
MCH: 31.6 pg (ref 26.0–34.0)
MCHC: 35.3 g/dL (ref 30.0–36.0)
MCV: 89.6 fL (ref 80.0–100.0)
Monocytes Absolute: 0.7 10*3/uL (ref 0.1–1.0)
Monocytes Relative: 11 %
Neutro Abs: 3.4 10*3/uL (ref 1.7–7.7)
Neutrophils Relative %: 52 %
Platelets: 285 10*3/uL (ref 150–400)
RBC: 5.09 MIL/uL (ref 4.22–5.81)
RDW: 11.6 % (ref 11.5–15.5)
WBC: 6.6 10*3/uL (ref 4.0–10.5)
nRBC: 0 % (ref 0.0–0.2)

## 2020-09-21 LAB — LIPASE, BLOOD: Lipase: 47 U/L (ref 11–51)

## 2020-09-21 MED ORDER — OXYCODONE-ACETAMINOPHEN 5-325 MG PO TABS: 2.0000 | ORAL_TABLET | Freq: Four times a day (QID) | ORAL | 0 refills | Status: DC | PRN

## 2020-09-21 MED ORDER — MORPHINE SULFATE (PF) 4 MG/ML IV SOLN
4.0000 mg | Freq: Once | INTRAVENOUS | Status: AC
  Administered 2020-09-21: 4 mg via INTRAVENOUS
  Filled 2020-09-21: qty 1

## 2020-09-21 MED ORDER — DIPHENHYDRAMINE HCL 50 MG/ML IJ SOLN
25.0000 mg | INTRAMUSCULAR | Status: DC
  Filled 2020-09-21: qty 1

## 2020-09-21 MED ORDER — ONDANSETRON 4 MG PO TBDP: ORAL_TABLET | ORAL | 0 refills | Status: DC

## 2020-09-21 MED ORDER — KETOROLAC TROMETHAMINE 30 MG/ML IJ SOLN
15.0000 mg | Freq: Once | INTRAMUSCULAR | Status: AC
  Administered 2020-09-21: 15 mg via INTRAVENOUS
  Filled 2020-09-21: qty 1

## 2020-09-21 MED ORDER — ONDANSETRON HCL 4 MG/2ML IJ SOLN
4.0000 mg | INTRAMUSCULAR | Status: AC
  Administered 2020-09-21: 4 mg via INTRAVENOUS
  Filled 2020-09-21: qty 2

## 2020-09-21 MED ORDER — DOCUSATE SODIUM 100 MG PO CAPS: ORAL_CAPSULE | ORAL | 0 refills | Status: DC

## 2020-09-21 MED ORDER — TAMSULOSIN HCL 0.4 MG PO CAPS: ORAL_CAPSULE | ORAL | 0 refills | Status: DC

## 2020-09-21 NOTE — ED Triage Notes (Signed)
Pt arrived to ed via pov c/o RLQ pain and n/v starting 2 hours ago. Pt reports hx of kidney stones but states this feels different. Reports pain is sharp and constant. Pt reports normal urination yesterday, but only voiding small amounts at a time since pain. MD present to assess at triage

## 2020-09-21 NOTE — Discharge Instructions (Addendum)
You have been seen in the Emergency Department (ED) today for pain caused by kidney stones.  As we have discussed, please drink plenty of fluids.  Please make a follow up appointment with the physician(s) listed elsewhere in this documentation.  You may take pain medication as needed but ONLY as prescribed.  Please also take your prescribed Flomax daily.  If you are going to follow up with Urology for possible lithotripsy, please do not take any ibuprofen, naproxen, aspirin, Toradol, or other NSAID after Tuesday morning, as this may exclude you from lithotripsy.  Please see your doctor as soon as possible as stones may take 1-3 weeks to pass and you may require additional care or medications.  Do not drink alcohol, drive or participate in any other potentially dangerous activities while taking opiate pain medication as it may make you sleepy. Do not take this medication with any other sedating medications, either prescription or over-the-counter. If you were prescribed Percocet or Vicodin, do not take these with acetaminophen (Tylenol) as it is already contained within these medications.   Take Percocet as needed for severe pain.  This medication is an opiate (or narcotic) pain medication and can be habit forming.  Use it as little as possible to achieve adequate pain control.  Do not use or use it with extreme caution if you have a history of opiate abuse or dependence.  If you are on a pain contract with your primary care doctor or a pain specialist, be sure to let them know you were prescribed this medication today from the [facility].  This medication is intended for your use only - do not give any to anyone else and keep it in a secure place where nobody else, especially children, have access to it.  It will also cause or worsen constipation, so you may want to consider taking an over-the-counter stool softener while you are taking this medication.  Return to the Emergency  Department (ED) or call your doctor if you have any worsening pain, fever, painful urination, are unable to urinate, or develop other symptoms that concern you.  

## 2020-09-21 NOTE — ED Provider Notes (Signed)
[facility] Emergency Department Provider Note  ____________________________________________   Event Date/Time   First MD Initiated Contact with Patient 09/21/20 [phone]     (approximate)  I have reviewed the triage vital signs and the nursing notes.   HISTORY  Chief Complaint Abdominal Pain    HPI Gregory Guzman is a 50 y.o. male with medical history as listed below which notably includes prior kidney stones who presents by private vehicle for evaluation of cute onset and severe sharp stabbing constant pain in his right lower abdomen.  He said he felt completely normal when he went to bed last night and had a normal day yesterday with no trauma or other indication of injury or infection.  He awoke from sleep with the pain and it has been unrelenting and severe since that time.  It has been accompanied with nausea and several episodes of vomiting.  Nothing in particular makes it better or worse and he cannot find a position of comfort.  He has had no hematuria and has not urinated very much since the pain began.  No recent dysuria.  He denies chest pain, shortness of breath, and fever.     Past Medical History:  Diagnosis Date   Allergy    Chicken pox    Chronic kidney disease    Depression    Difficult intubation    Hematuria    HLD (hyperlipidemia)    Kidney stone    Rosacea    Sleep apnea     Patient Active Problem List   Diagnosis Date Noted   HLD (hyperlipidemia) 06/01/2014   Clinical depression 12/01/2013   Apnea, sleep 12/01/2013    Past Surgical History:  Procedure Laterality Date   BACK SURGERY     CYSTOSCOPY W/ RETROGRADES Left 03/01/2015   Procedure: CYSTOSCOPY WITH RETROGRADE PYELOGRAM;  Surgeon: Vanna Scotland, MD;  Location: [facility] OR[facility];  Service: Urology;  Laterality: Left;   CYSTOSCOPY WITH STENT PLACEMENT Right 03/08/2015   Procedure: CYSTOSCOPY WITH   STENT PLACEMENT;  Surgeon: Vanna Scotland, MD;  Location: [facility] OR[facility];  Service:  Urology;  Laterality: Right;   CYSTOSCOPY/URETEROSCOPY/HOLMIUM LASER/STENT PLACEMENT Right 03/01/2015   Procedure: CYSTOSCOPY/URETEROSCOPY;  Surgeon: Vanna Scotland, MD;  Location: [facility] OR[facility];  Service: Urology;  Laterality: Right;   LITHOTRIPSY N/A    NASAL SINUS SURGERY     x2   URETEROSCOPY WITH HOLMIUM LASER LITHOTRIPSY Right 03/08/2015   Procedure: URETEROSCOPY WITH HOLMIUM LASER LITHOTRIPSY AND RETROGRADE LZJQBHAL;  Surgeon: Vanna Scotland, MD;  Location: [facility];  Service: Urology;  Laterality: Right;    Prior to Admission medications   Medication Sig Start Date End Date Taking? Authorizing Provider  docusate sodium (COLACE) 100 MG capsule Take 1 tablet once or twice daily as needed for constipation while taking narcotic pain medicine 09/21/20  Yes Loleta Rose, MD  ondansetron (ZOFRAN ODT) 4 MG disintegrating tablet Allow 1-2 tablets to dissolve in your mouth every 8 hours as needed for nausea/vomiting 09/21/20  Yes Loleta Rose, MD  oxyCODONE-acetaminophen (PERCOCET) 5-325 MG tablet Take 2 tablets by mouth every 6 (six) hours as needed for severe pain. 09/21/20  Yes Loleta Rose, MD  tamsulosin (FLOMAX) 0.4 MG CAPS capsule Take 1 tablet by mouth daily until you pass the kidney stone or no longer have symptoms 09/21/20  Yes Loleta Rose, MD  cetirizine (ZYRTEC) 10 MG tablet Take 10 mg by mouth daily.    [provider]  citalopram (CELEXA) 20 MG tablet Take 40 mg by  mouth every morning.  01/02/15   [provider]  cyclobenzaprine (FLEXERIL) 10 MG tablet Take 1 tablet (10 mg total) by mouth 3 (three) times daily as needed. 10/08/19   Joni Reining, PA-C  EPINEPHrine (EPIPEN 2-PAK) 0.3 mg/0.3 mL IJ SOAJ injection Reported on 04/14/2015 11/08/14   [provider]  ibuprofen (ADVIL) 600 MG tablet Take 1 tablet (600 mg total) by mouth every 8 (eight) hours as needed. 10/08/19   Joni Reining, PA-C  losartan (COZAAR) 25 MG tablet Take 25 mg by mouth daily.     [provider]  pravastatin (PRAVACHOL) 20 MG tablet TK 1 T PO NIGHTLY 01/25/15   [provider]    Allergies Patient has no known allergies.  Family History  Problem Relation Age of Onset   Kidney Stones Mother    Heart disease Father    Kidney Stones Maternal Grandmother    Heart disease Paternal Grandmother    Thyroid disease Sister     Social History Social History   Tobacco Use   Smoking status: Never   Smokeless tobacco: Never  Vaping Use   Vaping Use: Never used  Substance Use Topics   Alcohol use: Yes    Alcohol/week: 15.0 standard drinks    Types: 15 Standard drinks or equivalent per week    Comment: 15 drinks per week   Drug use: No    Review of Systems Constitutional: No fever/chills Eyes: No visual changes. ENT: No sore throat. Cardiovascular: Denies chest pain. Respiratory: Denies shortness of breath. Gastrointestinal: Acute onset severe sharp right lower quadrant pain accompanied with nausea and vomiting. Genitourinary: Negative for dysuria. Musculoskeletal: Negative for neck pain.  Negative for back pain. Integumentary: Negative for rash. Neurological: Negative for headaches, focal weakness or numbness.   ____________________________________________   PHYSICAL EXAM:  VITAL SIGNS: ED Triage Vitals  Enc Vitals Group     BP 09/21/20 0512 (!) 165/110     Pulse Rate 09/21/20 0512 78     Resp 09/21/20 0512 (!) 22     Temp 09/21/20 0512 97.6 F (36.4 C)     Temp src --      SpO2 09/21/20 0512 99 %     Weight 09/21/20 0514 77.1 kg (170 lb)     Height 09/21/20 0514 1.829 m (6')     Head Circumference --      Peak Flow --      Pain Score --      Pain Loc --      Pain Edu? --      Excl. in GC? --     Constitutional: Alert and oriented.  Appears severely uncomfortable and in pain. Eyes: Conjunctivae are normal.  Head: Atraumatic. Nose: No congestion/rhinnorhea. Mouth/Throat: Patient is wearing a mask. Neck: No stridor.   No meningeal signs.   Cardiovascular: Normal rate, regular rhythm. Good peripheral circulation. Respiratory: Normal respiratory effort.  No retractions. Gastrointestinal: Soft and nondistended.  Tenderness to palpation throughout the right side of his abdomen. Musculoskeletal: Tenderness to percussion of the right flank.  No gross deformities of extremities. Neurologic:  Normal speech and language. No gross focal neurologic deficits are appreciated.  Skin:  Skin is warm, dry and intact. Psychiatric: Mood and affect are normal under the circumstances of severe pain.  ____________________________________________   LABS (all labs ordered are listed, but only abnormal results are displayed)  Labs Reviewed  URINALYSIS, ROUTINE W REFLEX MICROSCOPIC - Abnormal; Notable for the following components:  Result Value   Color, Urine YELLOW (*)    APPearance HAZY (*)    Hgb urine dipstick LARGE (*)    RBC / HPF >50 (*)    Bacteria, UA RARE (*)    All other components within normal limits  COMPREHENSIVE METABOLIC PANEL - Abnormal; Notable for the following components:   Glucose, Bld 113 (*)    BUN 22 (*)    Calcium 8.6 (*)    Albumin 3.4 (*)    All other components within normal limits  CBC WITH DIFFERENTIAL/PLATELET  LIPASE, BLOOD   ____________________________________________   RADIOLOGY I, Loleta Rose, personally viewed and evaluated these images (plain radiographs) as part of my medical decision making, as well as reviewing the written report by the radiologist.  ED MD interpretation:  Obstructing stone at [facility]  Official radiology report(s): CT Renal Stone Study  Result Date: 09/21/2020 CLINICAL DATA:  Right lower quadrant abdominal pain EXAM: CT ABDOMEN AND PELVIS WITHOUT CONTRAST TECHNIQUE: Multidetector CT imaging of the abdomen and pelvis was performed following the standard protocol without IV contrast. COMPARISON:  None. FINDINGS: Lower chest: The visualized lung bases are  clear bilaterally. Mild right coronary artery calcification. Global cardiac size within normal limits. Hepatobiliary: No focal liver abnormality is seen. No gallstones, gallbladder wall thickening, or biliary dilatation. Pancreas: Unremarkable Spleen: Unremarkable Adrenals/Urinary Tract: The adrenal glands are unremarkable. The kidneys are normal in size and position. There is mild right hydronephrosis secondary to an obstructing 3 mm x 4 mm calculus at the right ureteropelvic junction. Superimposed 1-2 mm nonobstructing calculus is seen within the upper pole of the right kidney. No nephro or urolithiasis on the left. No hydronephrosis on the left. The bladder is unremarkable. Stomach/Bowel: Stomach is within normal limits. Appendix appears normal. No evidence of bowel wall thickening, distention, or inflammatory changes. No free intraperitoneal gas or fluid. Vascular/Lymphatic: Mild aortoiliac atherosclerotic calcification. No aortic aneurysm. No pathologic adenopathy within the abdomen and pelvis. Reproductive: Uterus and bilateral adnexa are unremarkable. Other: No abdominal wall hernia.  Rectum unremarkable. Musculoskeletal: Degenerative changes seen within the lumbar spine. No lytic or blastic bone lesions. No acute bone abnormality. IMPRESSION: Obstructing 3 x 4 mm calculus within the right ureteropelvic junction resulting in mild right hydronephrosis. Superimposed minimal right nonobstructing nephrolithiasis. Aortic Atherosclerosis (ICD10-I70.0). Electronically Signed   By: Helyn Numbers MD   On: 09/21/2020 06:43    ____________________________________________   PROCEDURES   Procedure(s) performed (including Critical Care):  Procedures   ____________________________________________   INITIAL IMPRESSION / MDM / ASSESSMENT AND PLAN / ED COURSE  As part of my medical decision making, I reviewed the following data within the electronic MEDICAL RECORD NUMBER Nursing notes reviewed and incorporated,  Labs reviewed , Old chart reviewed, Notes from prior ED visits, and [place] Controlled Substance Database   Differential diagnosis includes, but is not limited to, renal/ureteral stone, UTI/pyelonephritis, musculoskeletal strain, appendicitis.  Patient's presentation is very consistent with a ureteral stone.  His blood pressure is elevated and is slightly tachypneic but I am sure that is because of the pain and otherwise he is stable and his vital signs are within normal limits.  Morphine 4 mg IV, Toradol 15 mg IV, Zofran 4 mg IV.  Standard lab work including urinalysis is pending.  Once his pain is better controlled we will obtain a CT renal stone protocol.     Clinical Course as of 09/21/20 0728  Thu Sep 21, 2020  0610 Urinalysis, Routine w reflex microscopic(!) [CF]  [id number] Comprehensive metabolic panel(!) Essentially normal comprehensive metabolic panel with no sign of kidney injury or biliary obstruction.  Normal lipase. [CF]  0649 CT Renal Stone Study 3 x 4 mm right ureteropelvic stone with hydronephrosis.  I will reassess but the patient was feeling much better less than MyChart time.  I anticipate discharge with prescriptions and outpatient follow-up with urology. [CF]  [phone] Patient feels well, no more pain, no more vomiting.  I did him about the results of the stone and he is comfortable with the plan for discharge and outpatient follow-up.  Medications as listed below.  I gave my usual and customary return precautions. [CF]    Clinical Course User Index [CF] Loleta RoseForbach, Henlee Donovan, MD     ____________________________________________  FINAL CLINICAL IMPRESSION(S) / ED DIAGNOSES  Final diagnoses:  Ureteral stone with hydronephrosis  Kidney stones     MEDICATIONS GIVEN DURING THIS VISIT:  Medications  diphenhydrAMINE (BENADRYL) injection 25 mg (0 mg Intravenous Hold 09/21/20 0548)  morphine 4 MG/ML injection 4 mg (4 mg Intravenous Given 09/21/20 0530)  ketorolac (TORADOL) 30 MG/ML  injection 15 mg (15 mg Intravenous Given 09/21/20 0530)  ondansetron (ZOFRAN) injection 4 mg (4 mg Intravenous Given 09/21/20 0530)     ED Discharge Orders          Ordered    oxyCODONE-acetaminophen (PERCOCET) 5-325 MG tablet  Every 6 hours PRN        09/21/20 0720    ondansetron (ZOFRAN ODT) 4 MG disintegrating tablet        09/21/20 0720    tamsulosin (FLOMAX) 0.4 MG CAPS capsule        09/21/20 0720    docusate sodium (COLACE) 100 MG capsule        09/21/20 0720             Note:  This document was prepared using Dragon voice recognition software and may include unintentional dictation errors.   Loleta RoseForbach, Inocencio Roy, MD 09/21/20 [phone]

## 2020-09-26 ENCOUNTER — Other Ambulatory Visit: Payer: Self-pay

## 2020-09-26 DIAGNOSIS — N200 Calculus of kidney: Secondary | ICD-10-CM

## 2020-09-29 ENCOUNTER — Encounter: Payer: Self-pay | Admitting: Urology

## 2020-09-29 ENCOUNTER — Ambulatory Visit
Admission: RE | Admit: 2020-09-29 | Discharge: 2020-09-29 | Disposition: A | Discharge status: Home / Self Care | Payer: 59 | Attending: Urology | Admitting: Urology

## 2020-09-29 ENCOUNTER — Ambulatory Visit (INDEPENDENT_AMBULATORY_CARE_PROVIDER_SITE_OTHER): Payer: 59 | Admitting: Urology

## 2020-09-29 ENCOUNTER — Other Ambulatory Visit: Payer: Self-pay

## 2020-09-29 ENCOUNTER — Ambulatory Visit
Admission: RE | Admit: 2020-09-29 | Discharge: 2020-09-29 | Disposition: A | Discharge status: Home / Self Care | Payer: 59 | Source: Ambulatory Visit | Attending: Urology | Admitting: Urology

## 2020-09-29 VITALS — BP 137/79 | HR 99 | Ht 72.0 in | Wt 170.0 lb

## 2020-09-29 DIAGNOSIS — N1330 Unspecified hydronephrosis: Secondary | ICD-10-CM | POA: Diagnosis not present

## 2020-09-29 DIAGNOSIS — N200 Calculus of kidney: Secondary | ICD-10-CM | POA: Diagnosis not present

## 2020-09-29 DIAGNOSIS — N201 Calculus of ureter: Secondary | ICD-10-CM | POA: Diagnosis not present

## 2020-09-29 NOTE — Progress Notes (Signed)
09/29/2020 3:29 PM   Gregory Guzman 1971/02/16 [id number]  Referring provider: Barbette Reichmann, MD [address] [facility] [place],  [place] [address]  Chief Complaint  Patient presents with   Nephrolithiasis    HPI: 50 year old male who presents today for follow-up of kidney stones.  He was last seen in 2018 for stones has not had a stone since.  Unfortunately, he presented to the emergency room on 09/22/2018 with acute onset right flank pain associated with nausea and vomiting.  He had a CT scan which indicated a 3 x 4 mm right UPJ stone with proximal hydroureteronephrosis.  They were able to ultimately control his pain and he was discharged home with outpatient urologic follow-up.  He reports that his pain started to resolve in the emergency room he has not had another pain episode.  KUB today does however indicate that the stone is in similar position in the right proximal ureter.  He denies any urinary symptoms, fevers, chills, or flank pain.  He has been taking Flomax and has pain meds as backup.   PMH: Past Medical History:  Diagnosis Date   Allergy    Chicken pox    Chronic kidney disease    Depression    Difficult intubation    Hematuria    HLD (hyperlipidemia)    Kidney stone    Rosacea    Sleep apnea     Surgical History: Past Surgical History:  Procedure Laterality Date   BACK SURGERY     CYSTOSCOPY W/ RETROGRADES Left 03/01/2015   Procedure: CYSTOSCOPY WITH RETROGRADE PYELOGRAM;  Surgeon: Vanna Scotland, MD;  Location: [facility];  Service: Urology;  Laterality: Left;   CYSTOSCOPY WITH STENT PLACEMENT Right 03/08/2015   Procedure: CYSTOSCOPY WITH   STENT PLACEMENT;  Surgeon: Vanna Scotland, MD;  Location: [facility];  Service: Urology;  Laterality: Right;   CYSTOSCOPY/URETEROSCOPY/HOLMIUM LASER/STENT PLACEMENT Right 03/01/2015   Procedure: CYSTOSCOPY/URETEROSCOPY;  Surgeon: Vanna Scotland, MD;  Location: [facility];  Service: Urology;   Laterality: Right;   LITHOTRIPSY N/A    NASAL SINUS SURGERY     x2   URETEROSCOPY WITH HOLMIUM LASER LITHOTRIPSY Right 03/08/2015   Procedure: URETEROSCOPY WITH HOLMIUM LASER LITHOTRIPSY AND RETROGRADE JMEQASTM;  Surgeon: Vanna Scotland, MD;  Location: [facility];  Service: Urology;  Laterality: Right;    Home Medications:  Allergies as of 09/29/2020       Reactions   Other Shortness Of Breath   Tree pollen, pet dander, mold, fire ants, environmental   Pravastatin Other (See Comments)   SHOULDER PAIN. BETTER WHEN MEDICATION D/C'D        Medication List        Accurate as of September 29, 2020  3:29 PM. If you have any questions, ask your nurse or doctor.          STOP taking these medications    cyclobenzaprine 10 MG tablet Commonly known as: FLEXERIL Stopped by: Vanna Scotland, MD   docusate sodium 100 MG capsule Commonly known as: Colace Stopped by: Vanna Scotland, MD   ibuprofen 600 MG tablet Commonly known as: ADVIL Stopped by: Vanna Scotland, MD   losartan 25 MG tablet Commonly known as: COZAAR Stopped by: Vanna Scotland, MD   ondansetron 4 MG disintegrating tablet Commonly known as: Zofran ODT Stopped by: Vanna Scotland, MD   oxyCODONE-acetaminophen 5-325 MG tablet Commonly known as: Percocet Stopped by: Vanna Scotland, MD   rosuvastatin 5 MG tablet Commonly known as: CRESTOR Stopped by: Morrie Sheldon  Apolinar JunesBrandon, MD       TAKE these medications    cetirizine 10 MG tablet Commonly known as: ZYRTEC Take 10 mg by mouth daily.   citalopram 20 MG tablet Commonly known as: CELEXA Take 40 mg by mouth every morning.   EPINEPHrine 0.3 mg/0.3 mL Soaj injection Commonly known as: EPI-PEN Reported on 04/14/2015   losartan-hydrochlorothiazide 50-12.5 MG tablet Commonly known as: HYZAAR Take 1 tablet by mouth daily.   pravastatin 20 MG tablet Commonly known as: PRAVACHOL TK 1 T PO NIGHTLY   tamsulosin 0.4 MG Caps capsule Commonly known as: FLOMAX Take 1  tablet by mouth daily until you pass the kidney stone or no longer have symptoms        Allergies:  Allergies  Allergen Reactions   Other Shortness Of Breath    Tree pollen, pet dander, mold, fire ants, environmental   Pravastatin Other (See Comments)    SHOULDER PAIN. BETTER WHEN MEDICATION D/C'D    Family History: Family History  Problem Relation Age of Onset   Kidney Stones Mother    Heart disease Father    Kidney Stones Maternal Grandmother    Heart disease Paternal Grandmother    Thyroid disease Sister     Social History:  reports that he has never smoked. He has never used smokeless tobacco. He reports current alcohol use of about 15.0 standard drinks of alcohol per week. He reports that he does not use drugs.   Physical Exam: BP 137/79   Pulse 99   Ht 6' (1.829 m)   Wt 170 lb (77.1 kg)   BMI 23.06 kg/m   Constitutional:  Alert and oriented, No acute distress. HEENT: [place] AT, moist mucus membranes.  Trachea midline, no masses. Cardiovascular: No clubbing, cyanosis, or edema. Respiratory: Normal respiratory effort, no increased work of breathing. GI: Abdomen is soft, nontender, nondistended, no abdominal masses GU: No CVA tenderness Lymph: No cervical or inguinal lymphadenopathy. Skin: No rashes, bruises or suspicious lesions. Neurologic: Grossly intact, no focal deficits, moving all 4 extremities. Psychiatric: Normal mood and affect.  Laboratory Data: Lab Results  Component Value Date   WBC 6.6 09/21/2020   HGB 16.1 09/21/2020   HCT 45.6 09/21/2020   MCV 89.6 09/21/2020   PLT 285 09/21/2020    Lab Results  Component Value Date   CREATININE 1.15 09/21/2020    Urinalysis Component     Latest Ref Rng & Units 09/21/2020  Color, Urine     YELLOW YELLOW (A)  Appearance     CLEAR HAZY (A)  Specific Gravity, Urine     1.005 - 1.030 1.025  pH     5.0 - 8.0 5.0  Glucose, UA     NEGATIVE mg/dL NEGATIVE  Hgb urine dipstick     NEGATIVE LARGE (A)   Bilirubin Urine     NEGATIVE NEGATIVE  Ketones, ur     NEGATIVE mg/dL NEGATIVE  Protein     NEGATIVE mg/dL NEGATIVE  Nitrite     NEGATIVE NEGATIVE  Leukocytes,Ua     NEGATIVE NEGATIVE  RBC / HPF     0 - 5 RBC/hpf >50 (H)  WBC, UA     0 - 5 WBC/hpf 0-5  Bacteria, UA     NONE SEEN RARE (A)  Squamous Epithelial / LPF     0 - 5 NONE SEEN  Mucus      PRESENT     Pertinent Imaging:  CT Renal Stone Study  Narrative CLINICAL DATA:  Right lower quadrant abdominal pain  EXAM: CT ABDOMEN AND PELVIS WITHOUT CONTRAST  TECHNIQUE: Multidetector CT imaging of the abdomen and pelvis was performed following the standard protocol without IV contrast.  COMPARISON:  None.  FINDINGS: Lower chest: The visualized lung bases are clear bilaterally. Mild right coronary artery calcification. Global cardiac size within normal limits.  Hepatobiliary: No focal liver abnormality is seen. No gallstones, gallbladder wall thickening, or biliary dilatation.  Pancreas: Unremarkable  Spleen: Unremarkable  Adrenals/Urinary Tract: The adrenal glands are unremarkable. The kidneys are normal in size and position. There is mild right hydronephrosis secondary to an obstructing 3 mm x 4 mm calculus at the right ureteropelvic junction. Superimposed 1-2 mm nonobstructing calculus is seen within the upper pole of the right kidney. No nephro or urolithiasis on the left. No hydronephrosis on the left. The bladder is unremarkable.  Stomach/Bowel: Stomach is within normal limits. Appendix appears normal. No evidence of bowel wall thickening, distention, or inflammatory changes. No free intraperitoneal gas or fluid.  Vascular/Lymphatic: Mild aortoiliac atherosclerotic calcification. No aortic aneurysm. No pathologic adenopathy within the abdomen and pelvis.  Reproductive: Uterus and bilateral adnexa are unremarkable.  Other: No abdominal wall hernia.  Rectum unremarkable.  Musculoskeletal:  Degenerative changes seen within the lumbar spine. No lytic or blastic bone lesions. No acute bone abnormality.  IMPRESSION: Obstructing 3 x 4 mm calculus within the right ureteropelvic junction resulting in mild right hydronephrosis. Superimposed minimal right nonobstructing nephrolithiasis.  Aortic Atherosclerosis (ICD10-I70.0).   Electronically Signed By: Helyn Numbers MD On: 09/21/2020 06:43  CT stone protocol was personally reviewed today.  Agree with radiologic interpretation.  KUB which was ordered today was also personally reviewed.  Stone appears to be in a similar position, possibly slight distal migration but still present on KUB.  Assessment & Plan:    1. Right ureteral stone 4 mm right proximal ureteral calculus, retained, currently asymptomatic without interval passage  We discussed various treatment options for urolithiasis including observation with or without medical expulsive therapy, shockwave lithotripsy (SWL), ureteroscopy and laser lithotripsy with stent placement, and percutaneous nephrolithotomy.   We discussed that management is based on stone size, location, density, patient co-morbidities, and patient preference.    Stones <53mm in size have a >80% spontaneous passage rate. Data surrounding the use of tamsulosin for medical expulsive therapy is controversial, but meta analyses suggests it is most efficacious for distal stones between 5-37mm in size. Possible side effects include dizziness/lightheadedness, and retrograde ejaculation.   SWL has a lower stone free rate in a single procedure, but also a lower complication rate compared to ureteroscopy and avoids a stent and associated stent related symptoms. Possible complications include renal hematoma, steinstrasse, and need for additional treatment. We discussed the role of his increased skin to stone distance can lead to decreased efficacy with shockwave lithotripsy.   Ureteroscopy with laser lithotripsy and  stent placement has a higher stone free rate than SWL in a single procedure, however increased complication rate including possible infection, ureteral injury, bleeding, and stent related morbidity. Common stent related symptoms include dysuria, urgency/frequency, and flank pain.   After an extensive discussion of the risks and benefits of the above treatment options, the patient would like to wait an additional 2 weeks, repeat the KUB and see if the stone is passed.  In the interim, we will continue Flomax.  This was refilled for him today.  Warning symptoms reviewed with indications for more urgent/emergent reevaluation.  He understands and is agreeable this  plan.  He was also supplied with another urinary strainer today.   - DG Abd 1 View; Future  2. Hydronephrosis, right Secondary to #1   Vanna Scotland, MD  [facility] [address], [address] [place], [place] [address] [phone]

## 2020-10-06 ENCOUNTER — Other Ambulatory Visit: Payer: Self-pay | Admitting: *Deleted

## 2020-10-06 MED ORDER — TAMSULOSIN HCL 0.4 MG PO CAPS: ORAL_CAPSULE | ORAL | 0 refills | Status: DC

## 2020-10-13 ENCOUNTER — Other Ambulatory Visit
Admission: RE | Admit: 2020-10-13 | Discharge: 2020-10-13 | Disposition: A | Discharge status: Home / Self Care | Payer: 59 | Source: Home / Self Care | Attending: Urology | Admitting: Urology

## 2020-10-13 ENCOUNTER — Ambulatory Visit (INDEPENDENT_AMBULATORY_CARE_PROVIDER_SITE_OTHER): Payer: 59 | Admitting: Urology

## 2020-10-13 ENCOUNTER — Other Ambulatory Visit: Payer: Self-pay

## 2020-10-13 ENCOUNTER — Ambulatory Visit
Admission: RE | Admit: 2020-10-13 | Discharge: 2020-10-13 | Disposition: A | Discharge status: Home / Self Care | Payer: 59 | Attending: Urology | Admitting: Urology

## 2020-10-13 ENCOUNTER — Ambulatory Visit
Admission: RE | Admit: 2020-10-13 | Discharge: 2020-10-13 | Disposition: A | Discharge status: Home / Self Care | Payer: 59 | Source: Ambulatory Visit | Attending: Urology | Admitting: Urology

## 2020-10-13 VITALS — BP 133/88 | HR 105 | Ht 72.0 in | Wt 170.0 lb

## 2020-10-13 DIAGNOSIS — N201 Calculus of ureter: Secondary | ICD-10-CM | POA: Diagnosis not present

## 2020-10-13 LAB — URINALYSIS, DIPSTICK ONLY
Bilirubin Urine: NEGATIVE
Glucose, UA: NEGATIVE mg/dL
Hgb urine dipstick: NEGATIVE
Ketones, ur: NEGATIVE mg/dL
Leukocytes,Ua: NEGATIVE
Nitrite: NEGATIVE
Protein, ur: NEGATIVE mg/dL
Specific Gravity, Urine: 1.025 (ref 1.005–1.030)
pH: 7 (ref 5.0–8.0)

## 2020-10-13 NOTE — Progress Notes (Signed)
10/13/2020 2:42 PM   Gregory Guzman March 09, 1971 [id number]  Referring provider: Barbette Reichmann, MD [address] [facility] [place],  [place] [address]  Chief Complaint  Patient presents with   Nephrolithiasis    HPI: 50 year old male with personal history of nephrolithiasis who returns today for 2-week follow-up with KUB.  He had a small proximal right ureteral stone on September 21, 2020 in the setting of microscopic blood in his urine.  You presented to clinic 2 weeks ago with some ongoing discomfort.  KUB showed a probable retained stone in a similar location.  In the interim, he has been completely pain-free.  He has been working out in [employer] houses for out reach, been drinking copious amounts of fluid to stay well-hydrated and been voiding in the woods without straining his urine.  He does not know if he is passing so is not having any more symptoms.  KUB today is unremarkable, no obvious retained stone.  He does have a punctate nonobstructing stone in his right kidney.   PMH: Past Medical History:  Diagnosis Date   Allergy    Chicken pox    Chronic kidney disease    Depression    Difficult intubation    Hematuria    HLD (hyperlipidemia)    Kidney stone    Rosacea    Sleep apnea     Surgical History: Past Surgical History:  Procedure Laterality Date   BACK SURGERY     CYSTOSCOPY W/ RETROGRADES Left 03/01/2015   Procedure: CYSTOSCOPY WITH RETROGRADE PYELOGRAM;  Surgeon: Vanna Scotland, MD;  Location: [facility];  Service: Urology;  Laterality: Left;   CYSTOSCOPY WITH STENT PLACEMENT Right 03/08/2015   Procedure: CYSTOSCOPY WITH   STENT PLACEMENT;  Surgeon: Vanna Scotland, MD;  Location: [facility];  Service: Urology;  Laterality: Right;   CYSTOSCOPY/URETEROSCOPY/HOLMIUM LASER/STENT PLACEMENT Right 03/01/2015   Procedure: CYSTOSCOPY/URETEROSCOPY;  Surgeon: Vanna Scotland, MD;  Location: [facility];  Service: Urology;  Laterality: Right;    LITHOTRIPSY N/A    NASAL SINUS SURGERY     x2   URETEROSCOPY WITH HOLMIUM LASER LITHOTRIPSY Right 03/08/2015   Procedure: URETEROSCOPY WITH HOLMIUM LASER LITHOTRIPSY AND RETROGRADE [id number];  Surgeon: Vanna Scotland, MD;  Location: [facility];  Service: Urology;  Laterality: Right;    Home Medications:  Allergies as of 10/13/2020       Reactions   Other Shortness Of Breath   Tree pollen, pet dander, mold, fire ants, environmental   Pravastatin Other (See Comments)   SHOULDER PAIN. BETTER WHEN MEDICATION D/C'D        Medication List        Accurate as of October 13, 2020  2:42 PM. If you have any questions, ask your nurse or doctor.          cetirizine 10 MG tablet Commonly known as: ZYRTEC Take 10 mg by mouth daily.   citalopram 20 MG tablet Commonly known as: CELEXA Take 40 mg by mouth every morning.   EPINEPHrine 0.3 mg/0.3 mL Soaj injection Commonly known as: EPI-PEN Reported on 04/14/2015   losartan-hydrochlorothiazide 50-12.5 MG tablet Commonly known as: HYZAAR Take 1 tablet by mouth daily.   pravastatin 20 MG tablet Commonly known as: PRAVACHOL TK 1 T PO NIGHTLY   tamsulosin 0.4 MG Caps capsule Commonly known as: FLOMAX Take 1 tablet by mouth daily until you pass the kidney stone or no longer have symptoms        Allergies:  Allergies  Allergen Reactions   Other Shortness Of Breath    Tree pollen, pet dander, mold, fire ants, environmental   Pravastatin Other (See Comments)    SHOULDER PAIN. BETTER WHEN MEDICATION D/C'D    Family History: Family History  Problem Relation Age of Onset   Kidney Stones Mother    Heart disease Father    Kidney Stones Maternal Grandmother    Heart disease Paternal Grandmother    Thyroid disease Sister     Social History:  reports that he has never smoked. He has never used smokeless tobacco. He reports current alcohol use of about 15.0 standard drinks of alcohol per week. He reports that he does not use  drugs.   Physical Exam: BP 133/88   Pulse (!) 105   Constitutional:  Alert and oriented, No acute distress. HEENT: [place] AT, moist mucus membranes.  Trachea midline, no masses. Cardiovascular: No clubbing, cyanosis, or edema. Skin: No rashes, bruises or suspicious lesions. Neurologic: Grossly intact, no focal deficits, moving all 4 extremities. Psychiatric: Normal mood and affect.  Pertinent Imaging:   Abdomen 1 view (KUB) KUB was personally reviewed today and also with the patient in the room.  There is no obvious proximal ureteral stone is seen on previous KUB and CT scan.  There is a punctate nonobstructing stone in the right lower pole.  Awaiting final radiologic interpretation.  Assessment & Plan:    1. Right ureteral stone Presumed spontaneous interval passage of the stone based on absence of symptoms and reassuring KUB today  We discussed having a repeat urinalysis today would be helpful to ensure this microscopic hematuria has resolved as this will be another indication that he has spontaneously passed the stone  If his urine is negative for blood and he continues to have no symptoms, we will not repeat any further imaging and assume the stone has passed intervally.  If he does have residual microscopic blood in his urine, he is agreeable to a renal ultrasound to assess for resolution of his hydronephrosis.  He is agreeable this plan. - Urinalysis, dipstick only (For [facility] ONLY); Future  Call with UA results  Vanna Scotland, MD  [facility] [address] [place], [place] [address] [phone]

## 2020-10-16 ENCOUNTER — Telehealth: Payer: Self-pay | Admitting: *Deleted

## 2020-10-16 NOTE — Telephone Encounter (Addendum)
Left patient a VM with details, asked to return call with any questions.   ----- Message from Vanna Scotland, MD sent at 10/13/2020  3:51 PM EDT ----- No more blood in your urine, looks like he passed her stone, great news.  Vanna Scotland, MD

## 2021-10-26 ENCOUNTER — Other Ambulatory Visit: Payer: Self-pay | Admitting: Surgery

## 2021-11-16 ENCOUNTER — Other Ambulatory Visit: Payer: 59

## 2021-11-19 ENCOUNTER — Other Ambulatory Visit: Payer: Self-pay

## 2021-11-19 ENCOUNTER — Encounter
Admission: RE | Admit: 2021-11-19 | Discharge: 2021-11-19 | Disposition: A | Discharge status: Home / Self Care | Payer: 59 | Source: Ambulatory Visit | Attending: Surgery | Admitting: Surgery

## 2021-11-19 DIAGNOSIS — Z01812 Encounter for preprocedural laboratory examination: Secondary | ICD-10-CM

## 2021-11-19 HISTORY — DX: Personal history of urinary calculi: Z87.442

## 2021-11-19 HISTORY — DX: Essential (primary) hypertension: I10

## 2021-11-19 NOTE — Patient Instructions (Addendum)
Your procedure is scheduled on: 11/27/21 - Tuesday Report to the Registration Desk on the 1st floor of the [facility]. To find out your arrival time, please call [phone] between 1PM - 3PM on: 11/26/21 - Monday If your arrival time is 6:00 am, do not arrive prior to that time as the [facility] entrance doors do not open until 6:00 am.  REMEMBER: Instructions that are not followed completely may result in serious medical risk, up to and including death; or upon the discretion of your surgeon and anesthesiologist your surgery may need to be rescheduled.  Do not eat food after midnight the night before surgery.  No gum chewing, lozengers or hard candies.  You may however, drink CLEAR liquids up to 2 hours before you are scheduled to arrive for your surgery. Do not drink anything within 2 hours of your scheduled arrival time.  Clear liquids include: - water  - apple juice without pulp - gatorade (not RED colors) - black coffee or tea (Do NOT add milk or creamers to the coffee or tea) Do NOT drink anything that is not on this list.  In addition, your doctor has ordered for you to drink the provided  Ensure Pre-Surgery Clear Carbohydrate Drink  Drinking this carbohydrate drink up to two hours before surgery helps to reduce insulin resistance and improve patient outcomes. Please complete drinking 2 hours prior to scheduled arrival time.  TAKE THESE MEDICATIONS THE MORNING OF SURGERY WITH A SIP OF WATER:  - citalopram (CELEXA)   One week prior to surgery: Stop Anti-inflammatories (NSAIDS) such as Advil, Aleve, Ibuprofen, Motrin, Naproxen, Naprosyn and Aspirin based products such as Excedrin, Goodys Powder, BC Powder.  Stop ANY OVER THE COUNTER supplements until after surgery.  You may take Tylenol if needed for pain up until the day of surgery.  No Alcohol for 24 hours before or after surgery.  No Smoking including e-cigarettes for 24 hours prior to surgery.  No chewable  tobacco products for at least 6 hours prior to surgery.  No nicotine patches on the day of surgery.  Do not use any "recreational" drugs for at least a week prior to your surgery.  Please be advised that the combination of cocaine and anesthesia may have negative outcomes, up to and including death. If you test positive for cocaine, your surgery will be cancelled.  On the morning of surgery brush your teeth with toothpaste and water, you may rinse your mouth with mouthwash if you wish. Do not swallow any toothpaste or mouthwash.  Use CHG Soap or wipes as directed on instruction sheet.  Do not wear jewelry, make-up, hairpins, clips or nail polish.  Do not wear lotions, powders, or perfumes.   Do not shave body from the neck down 48 hours prior to surgery just in case you cut yourself which could leave a site for infection.  Also, freshly shaved skin may become irritated if using the CHG soap.  Contact lenses, hearing aids and dentures may not be worn into surgery.  Do not bring valuables to the hospital. [facility] is not responsible for any missing/lost belongings or valuables.   Bring your C-PAP to the hospital with you in case you may have to spend the night.   Notify your doctor if there is any change in your medical condition (cold, fever, infection).  Wear comfortable clothing (specific to your surgery type) to the hospital.  After surgery, you can help prevent lung complications by doing breathing exercises.  Take deep breaths and cough every 1-2 hours. Your doctor may order a device called an Incentive Spirometer to help you take deep breaths. When coughing or sneezing, hold a pillow firmly against your incision with both hands. This is called "splinting." Doing this helps protect your incision. It also decreases belly discomfort.  If you are being admitted to the hospital overnight, leave your suitcase in the car. After surgery it may be brought to your room.  If you are  being discharged the day of surgery, you will not be allowed to drive home. You will need a responsible adult (18 years or older) to drive you home and stay with you that night.   If you are taking public transportation, you will need to have a responsible adult (18 years or older) with you. Please confirm with your physician that it is acceptable to use public transportation.   Please call the Pre-admissions Testing Dept. at [phone] if you have any questions about these instructions.  Surgery Visitation Policy:  Patients undergoing a surgery or procedure may have two family members or support persons with them as long as the person is not COVID-19 positive or experiencing its symptoms.   Inpatient Visitation:    Visiting hours are 7 a.m. to 8 p.m. Up to four visitors are allowed at one time in a patient room, including children. The visitors may rotate out with other people during the day. One designated support person (adult) may remain overnight.

## 2021-11-20 ENCOUNTER — Encounter: Payer: Self-pay | Admitting: Urgent Care

## 2021-11-20 ENCOUNTER — Encounter
Admission: RE | Admit: 2021-11-20 | Discharge: 2021-11-20 | Disposition: A | Discharge status: Home / Self Care | Payer: 59 | Source: Ambulatory Visit | Attending: Surgery | Admitting: Surgery

## 2021-11-20 DIAGNOSIS — Z01812 Encounter for preprocedural laboratory examination: Secondary | ICD-10-CM

## 2021-11-20 DIAGNOSIS — Z01818 Encounter for other preprocedural examination: Secondary | ICD-10-CM | POA: Diagnosis present

## 2021-11-20 LAB — CBC
HCT: 47 % (ref 39.0–52.0)
Hemoglobin: 15.3 g/dL (ref 13.0–17.0)
MCH: 30 pg (ref 26.0–34.0)
MCHC: 32.6 g/dL (ref 30.0–36.0)
MCV: 92.2 fL (ref 80.0–100.0)
Platelets: 280 10*3/uL (ref 150–400)
RBC: 5.1 MIL/uL (ref 4.22–5.81)
RDW: 12 % (ref 11.5–15.5)
WBC: 5.4 10*3/uL (ref 4.0–10.5)
nRBC: 0 % (ref 0.0–0.2)

## 2021-11-20 LAB — BASIC METABOLIC PANEL
Anion gap: 6 (ref 5–15)
BUN: 18 mg/dL (ref 6–20)
CO2: 28 mmol/L (ref 22–32)
Calcium: 8.4 mg/dL — ABNORMAL LOW (ref 8.9–10.3)
Chloride: 103 mmol/L (ref 98–111)
Creatinine, Ser: 1.02 mg/dL (ref 0.61–1.24)
GFR, Estimated: >60 mL/min (ref >60)
Glucose, Bld: 121 mg/dL — ABNORMAL HIGH (ref 70–99)
Potassium: 3.4 mmol/L — ABNORMAL LOW (ref 3.5–5.1)
Sodium: 137 mmol/L (ref 135–145)

## 2021-11-27 ENCOUNTER — Encounter: Payer: Self-pay | Admitting: Surgery

## 2021-11-27 ENCOUNTER — Other Ambulatory Visit: Payer: Self-pay

## 2021-11-27 ENCOUNTER — Ambulatory Visit: Payer: 59

## 2021-11-27 ENCOUNTER — Encounter
Admission: RE | Disposition: A | Discharge status: Home / Self Care | Payer: Self-pay | Source: Home / Self Care | Attending: Surgery

## 2021-11-27 ENCOUNTER — Ambulatory Visit
Admission: RE | Admit: 2021-11-27 | Discharge: 2021-11-27 | Disposition: A | Discharge status: Home / Self Care | Payer: 59 | Attending: Surgery | Admitting: Surgery

## 2021-11-27 DIAGNOSIS — Z8619 Personal history of other infectious and parasitic diseases: Secondary | ICD-10-CM | POA: Diagnosis not present

## 2021-11-27 DIAGNOSIS — I129 Hypertensive chronic kidney disease with stage 1 through stage 4 chronic kidney disease, or unspecified chronic kidney disease: Secondary | ICD-10-CM | POA: Diagnosis not present

## 2021-11-27 DIAGNOSIS — M19012 Primary osteoarthritis, left shoulder: Secondary | ICD-10-CM | POA: Diagnosis not present

## 2021-11-27 DIAGNOSIS — G4730 Sleep apnea, unspecified: Secondary | ICD-10-CM | POA: Insufficient documentation

## 2021-11-27 DIAGNOSIS — E785 Hyperlipidemia, unspecified: Secondary | ICD-10-CM | POA: Diagnosis not present

## 2021-11-27 DIAGNOSIS — N189 Chronic kidney disease, unspecified: Secondary | ICD-10-CM | POA: Diagnosis not present

## 2021-11-27 DIAGNOSIS — M25812 Other specified joint disorders, left shoulder: Secondary | ICD-10-CM | POA: Diagnosis present

## 2021-11-27 HISTORY — PX: SHOULDER ARTHROSCOPY WITH DEBRIDEMENT AND BICEP TENDON REPAIR: SHX5690

## 2021-11-27 SURGERY — SHOULDER ARTHROSCOPY WITH DEBRIDEMENT AND BICEP TENDON REPAIR
Anesthesia: General | Site: Shoulder | Laterality: Left

## 2021-11-27 MED ORDER — ROCURONIUM BROMIDE 100 MG/10ML IV SOLN
INTRAVENOUS | Status: DC | PRN
  Administered 2021-11-27: 50 mg via INTRAVENOUS

## 2021-11-27 MED ORDER — CEFAZOLIN SODIUM-DEXTROSE 2-4 GM/100ML-% IV SOLN
2.0000 g | INTRAVENOUS | Status: AC
  Administered 2021-11-27: 2 g via INTRAVENOUS

## 2021-11-27 MED ORDER — CHLORHEXIDINE GLUCONATE 0.12 % MT SOLN: 15.0000 mL | Freq: Once | OROMUCOSAL | Status: AC

## 2021-11-27 MED ORDER — CHLORHEXIDINE GLUCONATE 0.12 % MT SOLN
OROMUCOSAL | Status: AC
  Administered 2021-11-27: 15 mL via OROMUCOSAL
  Filled 2021-11-27: qty 15

## 2021-11-27 MED ORDER — PROPOFOL 10 MG/ML IV BOLUS
INTRAVENOUS | Status: DC | PRN
  Administered 2021-11-27: 150 mg via INTRAVENOUS

## 2021-11-27 MED ORDER — FENTANYL CITRATE PF 50 MCG/ML IJ SOSY
PREFILLED_SYRINGE | INTRAMUSCULAR | Status: AC
  Administered 2021-11-27: 50 ug via INTRAVENOUS
  Filled 2021-11-27: qty 1

## 2021-11-27 MED ORDER — KETOROLAC TROMETHAMINE 30 MG/ML IJ SOLN
30.0000 mg | Freq: Once | INTRAMUSCULAR | Status: AC
  Administered 2021-11-27: 30 mg via INTRAVENOUS

## 2021-11-27 MED ORDER — DEXAMETHASONE SODIUM PHOSPHATE 10 MG/ML IJ SOLN
INTRAMUSCULAR | Status: AC
  Filled 2021-11-27: qty 1

## 2021-11-27 MED ORDER — BUPIVACAINE LIPOSOME 1.3 % IJ SUSP
INTRAMUSCULAR | Status: AC
  Filled 2021-11-27: qty 20

## 2021-11-27 MED ORDER — BUPIVACAINE-EPINEPHRINE (PF) 0.5% -1:200000 IJ SOLN
INTRAMUSCULAR | Status: AC
  Filled 2021-11-27: qty 30

## 2021-11-27 MED ORDER — BUPIVACAINE LIPOSOME 1.3 % IJ SUSP
INTRAMUSCULAR | Status: DC | PRN
  Administered 2021-11-27: 13 mL via PERINEURAL
  Administered 2021-11-27: 7 mL via PERINEURAL

## 2021-11-27 MED ORDER — FAMOTIDINE 20 MG PO TABS: 20.0000 mg | ORAL_TABLET | Freq: Once | ORAL | Status: AC

## 2021-11-27 MED ORDER — FAMOTIDINE 20 MG PO TABS
ORAL_TABLET | ORAL | Status: AC
  Administered 2021-11-27: 20 mg via ORAL
  Filled 2021-11-27: qty 1

## 2021-11-27 MED ORDER — ROCURONIUM BROMIDE 10 MG/ML (PF) SYRINGE
PREFILLED_SYRINGE | INTRAVENOUS | Status: AC
  Filled 2021-11-27: qty 10

## 2021-11-27 MED ORDER — MIDAZOLAM HCL 2 MG/2ML IJ SOLN
1.0000 mg | Freq: Once | INTRAMUSCULAR | Status: AC
Start: 2021-11-27 — End: 2021-11-27

## 2021-11-27 MED ORDER — ACETAMINOPHEN 10 MG/ML IV SOLN
INTRAVENOUS | Status: DC | PRN
  Administered 2021-11-27: 1000 mg via INTRAVENOUS

## 2021-11-27 MED ORDER — MIDAZOLAM HCL 2 MG/2ML IJ SOLN
INTRAMUSCULAR | Status: AC
  Administered 2021-11-27: 1 mg via INTRAVENOUS
  Filled 2021-11-27: qty 2

## 2021-11-27 MED ORDER — MIDAZOLAM HCL 2 MG/2ML IJ SOLN
1.0000 mg | Freq: Once | INTRAMUSCULAR | Status: DC
  Administered 2021-11-27: 1 mg via INTRAVENOUS

## 2021-11-27 MED ORDER — LIDOCAINE HCL (PF) 2 % IJ SOLN
INTRAMUSCULAR | Status: AC
  Filled 2021-11-27: qty 5

## 2021-11-27 MED ORDER — BUPIVACAINE HCL (PF) 0.5 % IJ SOLN
INTRAMUSCULAR | Status: AC
  Filled 2021-11-27: qty 10

## 2021-11-27 MED ORDER — ONDANSETRON HCL 4 MG/2ML IJ SOLN
INTRAMUSCULAR | Status: AC
  Filled 2021-11-27: qty 2

## 2021-11-27 MED ORDER — MIDAZOLAM HCL 2 MG/2ML IJ SOLN
INTRAMUSCULAR | Status: DC | PRN
  Administered 2021-11-27: 1 mg via INTRAVENOUS

## 2021-11-27 MED ORDER — MIDAZOLAM HCL 2 MG/2ML IJ SOLN
INTRAMUSCULAR | Status: AC
Start: 2021-11-27 — End: ?
  Filled 2021-11-27: qty 2

## 2021-11-27 MED ORDER — KETOROLAC TROMETHAMINE 30 MG/ML IJ SOLN
INTRAMUSCULAR | Status: AC
  Filled 2021-11-27: qty 1

## 2021-11-27 MED ORDER — LACTATED RINGERS IR SOLN
Status: DC | PRN
  Administered 2021-11-27: 1501 mL

## 2021-11-27 MED ORDER — PHENYLEPHRINE HCL-NACL 20-0.9 MG/250ML-% IV SOLN
INTRAVENOUS | Status: DC | PRN
  Administered 2021-11-27: 10 ug/min via INTRAVENOUS

## 2021-11-27 MED ORDER — ORAL CARE MOUTH RINSE: 15.0000 mL | Freq: Once | OROMUCOSAL | Status: AC

## 2021-11-27 MED ORDER — EPINEPHRINE PF 1 MG/ML IJ SOLN
INTRAMUSCULAR | Status: AC
  Filled 2021-11-27: qty 1

## 2021-11-27 MED ORDER — ONDANSETRON HCL 4 MG/2ML IJ SOLN
INTRAMUSCULAR | Status: DC | PRN
  Administered 2021-11-27: 4 mg via INTRAVENOUS

## 2021-11-27 MED ORDER — PHENYLEPHRINE HCL-NACL 20-0.9 MG/250ML-% IV SOLN
INTRAVENOUS | Status: AC
Start: 2021-11-27 — End: ?
  Filled 2021-11-27: qty 250

## 2021-11-27 MED ORDER — OXYCODONE HCL 5 MG/5ML PO SOLN: 5.0000 mg | Freq: Once | ORAL | Status: DC | PRN

## 2021-11-27 MED ORDER — FENTANYL CITRATE (PF) 100 MCG/2ML IJ SOLN
INTRAMUSCULAR | Status: DC | PRN
  Administered 2021-11-27: 50 ug via INTRAVENOUS

## 2021-11-27 MED ORDER — LIDOCAINE HCL (CARDIAC) PF 100 MG/5ML IV SOSY
PREFILLED_SYRINGE | INTRAVENOUS | Status: DC | PRN
  Administered 2021-11-27: 100 mg via INTRAVENOUS

## 2021-11-27 MED ORDER — SUGAMMADEX SODIUM 200 MG/2ML IV SOLN
INTRAVENOUS | Status: DC | PRN
  Administered 2021-11-27: 200 mg via INTRAVENOUS

## 2021-11-27 MED ORDER — OXYCODONE HCL 5 MG PO TABS: 5.0000 mg | ORAL_TABLET | ORAL | 0 refills | Status: AC | PRN

## 2021-11-27 MED ORDER — OXYCODONE HCL 5 MG PO TABS: 5.0000 mg | ORAL_TABLET | Freq: Once | ORAL | Status: DC | PRN

## 2021-11-27 MED ORDER — ACETAMINOPHEN 10 MG/ML IV SOLN
INTRAVENOUS | Status: AC
  Filled 2021-11-27: qty 100

## 2021-11-27 MED ORDER — LACTATED RINGERS IV SOLN: INTRAVENOUS | Status: DC

## 2021-11-27 MED ORDER — FENTANYL CITRATE (PF) 100 MCG/2ML IJ SOLN: 25.0000 ug | INTRAMUSCULAR | Status: DC | PRN

## 2021-11-27 MED ORDER — BUPIVACAINE-EPINEPHRINE (PF) 0.5% -1:200000 IJ SOLN
INTRAMUSCULAR | Status: DC | PRN
  Administered 2021-11-27: 30 mL via PERINEURAL

## 2021-11-27 MED ORDER — FENTANYL CITRATE (PF) 100 MCG/2ML IJ SOLN
INTRAMUSCULAR | Status: AC
  Filled 2021-11-27: qty 2

## 2021-11-27 MED ORDER — BUPIVACAINE HCL (PF) 0.5 % IJ SOLN
INTRAMUSCULAR | Status: DC | PRN
  Administered 2021-11-27: 3 mL via PERINEURAL
  Administered 2021-11-27: 7 mL via PERINEURAL

## 2021-11-27 MED ORDER — PROPOFOL 10 MG/ML IV BOLUS
INTRAVENOUS | Status: AC
  Filled 2021-11-27: qty 20

## 2021-11-27 MED ORDER — FENTANYL CITRATE PF 50 MCG/ML IJ SOSY: 50.0000 ug | PREFILLED_SYRINGE | Freq: Once | INTRAMUSCULAR | Status: AC

## 2021-11-27 MED ORDER — CEFAZOLIN SODIUM-DEXTROSE 2-4 GM/100ML-% IV SOLN
INTRAVENOUS | Status: AC
  Filled 2021-11-27: qty 100

## 2021-11-27 MED ORDER — DEXAMETHASONE SODIUM PHOSPHATE 10 MG/ML IJ SOLN
INTRAMUSCULAR | Status: DC | PRN
  Administered 2021-11-27: 8 mg via INTRAVENOUS

## 2021-11-27 SURGICAL SUPPLY — 50 items
ANCH SUT 2 2.9 2 LD TPR NDL (Anchor) ×1 IMPLANT
ANCHOR JUGGERKNOT WTAP NDL 2.9 (Anchor) IMPLANT
APL PRP STRL LF DISP 70% ISPRP (MISCELLANEOUS) ×1
BIT DRILL JUGRKNT W/NDL BIT2.9 (DRILL) IMPLANT
BLADE FULL RADIUS 3.5 (BLADE) ×1 IMPLANT
BUR ACROMIONIZER 4.0 (BURR) ×1 IMPLANT
CANNULA SHAVER 8MMX76MM (CANNULA) ×1 IMPLANT
CHLORAPREP W/TINT 26 (MISCELLANEOUS) ×1 IMPLANT
COVER MAYO STAND REUSABLE (DRAPES) ×1 IMPLANT
DILATOR 5.5 THREADED HEALICOIL (MISCELLANEOUS) IMPLANT
DRILL JUGGERKNOT W/NDL BIT 2.9 (DRILL) ×1
ELECT CAUTERY BLADE 6.4 (BLADE) ×1 IMPLANT
ELECT REM PT RETURN 9FT ADLT (ELECTROSURGICAL) ×1
ELECTRODE REM PT RTRN 9FT ADLT (ELECTROSURGICAL) ×1 IMPLANT
GAUZE SPONGE 4X4 12PLY STRL (GAUZE/BANDAGES/DRESSINGS) ×1 IMPLANT
GAUZE XEROFORM 1X8 LF (GAUZE/BANDAGES/DRESSINGS) ×1 IMPLANT
GLOVE BIO SURGEON STRL SZ7.5 (GLOVE) ×2 IMPLANT
GLOVE BIO SURGEON STRL SZ8 (GLOVE) ×2 IMPLANT
GLOVE BIOGEL PI IND STRL 8 (GLOVE) ×1 IMPLANT
GLOVE SURG UNDER LTX SZ8 (GLOVE) ×1 IMPLANT
GOWN STRL REUS W/ TWL LRG LVL3 (GOWN DISPOSABLE) ×1 IMPLANT
GOWN STRL REUS W/ TWL XL LVL3 (GOWN DISPOSABLE) ×1 IMPLANT
GOWN STRL REUS W/TWL LRG LVL3 (GOWN DISPOSABLE) ×1
GOWN STRL REUS W/TWL XL LVL3 (GOWN DISPOSABLE) ×1
GRASPER SUT 15 45D LOW PRO (SUTURE) IMPLANT
IV LACTATED RINGER IRRG 3000ML (IV SOLUTION) ×1
IV LR IRRIG 3000ML ARTHROMATIC (IV SOLUTION) ×2 IMPLANT
KIT CANNULA 8X76-LX IN CANNULA (CANNULA) IMPLANT
MANIFOLD NEPTUNE II (INSTRUMENTS) ×2 IMPLANT
MASK FACE SPIDER DISP (MASK) ×1 IMPLANT
MAT ABSORB  FLUID 56X50 GRAY (MISCELLANEOUS) ×1
MAT ABSORB FLUID 56X50 GRAY (MISCELLANEOUS) ×1 IMPLANT
PACK ARTHROSCOPY SHOULDER (MISCELLANEOUS) ×1 IMPLANT
PAD ABD DERMACEA PRESS 5X9 (GAUZE/BANDAGES/DRESSINGS) ×2 IMPLANT
PASSER SUT FIRSTPASS SELF (INSTRUMENTS) IMPLANT
SLING ARM LRG DEEP (SOFTGOODS) ×1 IMPLANT
SLING ULTRA II LG (MISCELLANEOUS) ×1 IMPLANT
SPONGE T-LAP 18X18 ~~LOC~~+RFID (SPONGE) ×1 IMPLANT
STAPLER SKIN PROX 35W (STAPLE) ×1 IMPLANT
STRAP SAFETY 5IN WIDE (MISCELLANEOUS) ×1 IMPLANT
SUT ETHIBOND 0 MO6 C/R (SUTURE) ×1 IMPLANT
SUT ULTRABRAID 2 COBRAID 38 (SUTURE) IMPLANT
SUT VIC AB 2-0 CT1 27 (SUTURE) ×2
SUT VIC AB 2-0 CT1 TAPERPNT 27 (SUTURE) ×2 IMPLANT
TAPE MICROFOAM 4IN (TAPE) ×1 IMPLANT
TRAP FLUID SMOKE EVACUATOR (MISCELLANEOUS) ×1 IMPLANT
TUBING CONNECTING 10 (TUBING) ×1 IMPLANT
TUBING INFLOW SET DBFLO PUMP (TUBING) ×1 IMPLANT
WAND WEREWOLF FLOW 90D (MISCELLANEOUS) ×1 IMPLANT
WATER STERILE IRR 500ML POUR (IV SOLUTION) ×1 IMPLANT

## 2021-11-27 NOTE — Anesthesia Procedure Notes (Addendum)
Procedure Name: Intubation Date/Time: 11/27/2021 2:30 PM  Performed by: Lynden Oxford, CRNAPre-anesthesia Checklist: Patient identified, Emergency Drugs available, Suction available and Patient being monitored Patient Re-evaluated:Patient Re-evaluated prior to induction Oxygen Delivery Method: Circle system utilized Preoxygenation: Pre-oxygenation with 100% oxygen Induction Type: IV induction Ventilation: Mask ventilation without difficulty Laryngoscope Size: McGraph and 4 Grade View: Grade I Tube type: Oral Tube size: 7.0 mm Number of attempts: 1 Airway Equipment and Method: Stylet and Oral airway Placement Confirmation: ETT inserted through vocal cords under direct vision, positive ETCO2 and breath sounds checked- equal and bilateral Secured at: 21 cm Tube secured with: Tape Dental Injury: Teeth and Oropharynx as per pre-operative assessment  Comments: Lauren Cozart, SRNA placed ETT under supervision.

## 2021-11-27 NOTE — Op Note (Signed)
11/27/2021  4:01 PM  Patient:   Gregory Guzman  Pre-Op Diagnosis:   Impingement/tendinopathy with biceps tendinopathy, left shoulder.  Post-Op Diagnosis:   Impingement/tendinopathy with degenerative labral fraying and biceps tendinopathy, left shoulder.  Procedure:   Limited arthroscopic debridement, arthroscopic subacromial decompression, and mini-open biceps tenodesis, left shoulder.  Anesthesia:   General endotracheal with interscalene block using Exparel placed preoperatively by the anesthesiologist.  Surgeon:   Maryagnes Amos, MD  Assistant:   Griffin Basil, RNFA  Findings:   As above. There was moderate fraying involving the anterior, superior, and posterior superior portions of the labrum without frank detachment from the glenoid rim. There was moderate tendinopathy of the biceps tendon without partial or full-thickness tearing. The rotator cuff itself was in excellent condition, as were the articular surfaces of the glenoid and humerus.  Complications:   None  Fluids:   800 cc  Estimated blood loss:   10 cc  Tourniquet time:   None  Drains:   None  Closure:   Staples      Brief clinical note:   The patient is a 51 year old male with a long history of left shoulder pain. The patient's symptoms have progressed despite medications, activity modification, etc. The patient's history and examination are consistent with impingement/tendinopathy with a possible rotator cuff tear. His preoperative MRI scan demonstrated evidence of biceps tendinopathy and impingement without partial or full-thickness tearing of the rotator cuff. The patient presents at this time for definitive management of these shoulder symptoms.  Procedure:   The patient underwent placement of an interscalene block using Exparel by the anesthesiologist in the preoperative holding area before being brought into the operating room and lain in the supine position. The patient then underwent general endotracheal  intubation and anesthesia before being repositioned in the beach chair position using the beach chair positioner. The left shoulder and upper extremity were prepped with ChloraPrep solution before being draped sterilely. Preoperative antibiotics were administered. A timeout was performed to confirm the proper surgical site before the expected portal sites and incision site were injected with 0.5% Sensorcaine with epinephrine.   A posterior portal was created and the glenohumeral joint thoroughly inspected with the findings as described above. An anterior portal was created using an outside-in technique. The labrum and rotator cuff were further probed, again confirming the above-noted findings. The areas of labral fraying were debrided back to stable margins using the full-radius resector, as were areas of synovitis. The ArthroCare wand was inserted and used to release the biceps tendon from its labral anchor. It also was used to obtain hemostasis as well as to "anneal" the labrum superiorly and anteriorly. The instruments were removed from the joint after suctioning the excess fluid.  The camera was repositioned through the posterior portal into the subacromial space. A separate lateral portal was created using an outside-in technique. The 3.5 mm full-radius resector was introduced and used to perform a subtotal bursectomy. The ArthroCare wand was then inserted and used to remove the periosteal tissue off the undersurface of the anterior third of the acromion as well as to recess the coracoacromial ligament from its attachment along the anterior and lateral margins of the acromion. The 4.0 mm acromionizing bur was introduced and used to complete the decompression by removing the undersurface of the anterior third of the acromion. The full radius resector was reintroduced to remove any residual bony debris before the ArthroCare wand was reintroduced to obtain hemostasis. The instruments were then removed from the  subacromial space after suctioning the excess fluid.  An approximately 3-4 cm incision was made over the anterolateral aspect of the shoulder beginning at the anterolateral corner of the acromion and extending distally in line with the bicipital groove. This incision was carried down through the subcutaneous tissues to expose the deltoid fascia. The raphae between the anterior and middle thirds was identified and this plane developed to provide access into the subacromial space. Additional bursal tissues were debrided sharply using Metzenbaum scissors. The rotator cuff was carefully inspected and found to be intact both visually and by palpation.  The bicipital groove was identified by palpation and opened for 1-1.5 cm. The biceps tendon stump was retrieved through this defect. The floor of the bicipital groove was roughened with a curet before a single Biomet 2.9 mm JuggerKnot anchor was inserted. Both sets of sutures were passed through the biceps tendon and tied securely to effect the tenodesis. The bicipital sheath was reapproximated using two #0 Ethibond interrupted sutures, incorporating the biceps tendon to further reinforce the tenodesis.  The wound was copiously irrigated with sterile saline solution before the deltoid raphae was reapproximated using 2-0 Vicryl interrupted sutures. The subcutaneous tissues were closed in two layers using 2-0 Vicryl interrupted sutures before the skin was closed using staples. The portal sites also were closed using staples. A sterile bulky dressing was applied to the shoulder before the arm was placed into a shoulder immobilizer. The patient was then awakened, extubated, and returned to the recovery room in satisfactory condition after tolerating the procedure well.

## 2021-11-27 NOTE — H&P (Signed)
History of Present Illness: Gregory Guzman is a 51 y.o. male who presents today for his surgical history and physical for upcoming left shoulder arthroscopy with debridement, decompression, potential labral repair and probable biceps tenodesis. Surgery is scheduled with Dr. Joice Lofts on 11/27/2021. The patient denies any changes in his medical history since he was last evaluated. He denies any falls or trauma affecting left shoulder since his last evaluation. The patient has undergone several left knee steroid injections all of which have provided mild relief of his left shoulder discomfort. The patient has been taking Aleve or ibuprofen for discomfort. He denies any numbness or tingling to the left upper extremity. He denies any radiating pain down the left arm. Pain score in the left shoulder at today's visit is a 3 out of 10. The patient denies any personal history of heart attack, stroke, asthma or COPD. No personal history of blood clots.  Past Medical History: Allergic state  History of chicken pox  Hyperlipidemia  Nephrolithiasis  Rosacea   Past Surgical History: COLONOSCOPY 05/10/2019  Hemorrhoids/Otherwise normal colon/Repeat 72yrs/[facility]  Diskectomy L4-5   Past Family History: Coronary Artery Disease (Blocked arteries around heart) Father   Medications: BD ALLERGIST TRAY REG BEVEL 1 mL 27 x 1/2" Syrg USE UTD 0  cetirizine HCl (ZYRTEC ORAL) Take 10 mg by mouth once daily.  citalopram (CELEXA) 10 MG tablet 1-2 tabs a day (Patient taking differently: 15 mg 1-2 tabs a day) 180 tablet 3  EPIPEN 2-PAK 0.3 mg/0.3 mL pen injector INJECT INTRAMUSCULARLY AS DIRECTED 0  fluticasone (FLONASE) 50 mcg/actuation nasal spray Place 2 sprays into both nostrils once daily.  losartan-hydroCHLOROthiazide (HYZAAR) 50-12.5 mg tablet TAKE 1 TABLET BY MOUTH ONCE DAILY 90 tablet 3  rosuvastatin (CRESTOR) 5 MG tablet TAKE 1 TABLET BY MOUTH ONCE DAILY 90 tablet 3  UNABLE TO FIND Fit for nasal pillow for CPAP Dx  G47.33 1 Unspecified 0  clotrimazole-betamethasone (LOTRISONE) 1-0.05 % cream Apply topically 2 (two) times daily (Patient not taking: Reported on 11/19/2021) 45 g 1   Allergies:  Tree pollen, pet dander, mold, fire ants, environmental (Shortness of breath) Pravastatin Other (SHOULDER PAIN)   Review of Systems:  A comprehensive 14 point ROS was performed, reviewed by me today, and the pertinent orthopaedic findings are documented in the HPI.  Physical Exam: BP 132/84 (BP Location: Left upper arm, Patient Position: Sitting, BP Cuff Size: Adult)  Ht 182.9 cm (6')  Wt 80.1 kg (176 lb 9.6 oz)  BMI 23.95 kg/m  General/Constitutional: The patient appears to be well-nourished, well-developed, and in no acute distress. Neuro/Psych: Normal mood and affect, oriented to person, place and time. Eyes: Non-icteric. Pupils are equal, round, and reactive to light, and exhibit synchronous movement. ENT: Unremarkable. Lymphatic: No palpable adenopathy. Respiratory: Lungs clear to auscultation, Normal chest excursion, No wheezes, and Non-labored breathing Cardiovascular: Regular rate and rhythm. No murmurs. and No edema, swelling or tenderness, except as noted in detailed exam. Integumentary: No impressive skin lesions present, except as noted in detailed exam. Musculoskeletal: Unremarkable, except as noted in detailed exam.  Left shoulder exam: SKIN: normal SWELLING: none WARMTH: none LYMPH NODES: no adenopathy palpable CREPITUS: none TENDERNESS: Moderate focal tenderness anteriorly over bicipital groove ROM (active):  Forward flexion: 180 degrees Abduction: 175 degrees Internal rotation: T11 ROM (passive):  Forward flexion: 185 degrees Abduction: 180 degrees ER/IR at 90 abd: 90 degrees / 70 degrees  He notes mild pain anteriorly with external rotation at 90 degrees of abduction.  STRENGTH: Forward flexion:  4+/5 Abduction: 5/5 External rotation: 5/5 Internal rotation: Normal Pain with RC  testing: Mild pain with resisted forward flexion  STABILITY: Normal  SPECIAL TESTS: Juanetta Gosling' test: negative Speed's test: negative Capsulitis - pain w/ passive ER: no Crossed arm test: Negative Crank: Not evaluated Anterior apprehension: Minimally positive Posterior apprehension: Negative  He is neurovascularly intact to the left upper extremity.  Left Shoulder Imaging, MRI: MRI Shoulder Cartilage: No cartilage abnormality. Moderate AC joint degenerative changes. MRI Shoulder Rotator Cuff: No rotator cuff tear is present. No retraction. MRI Shoulder Labrum / Biceps: No obvious biceps tendinopathy MRI Shoulder Bone: Normal bone.  Impression: 1. Tendinitis of upper biceps tendon of left shoulder. 2. Rotator cuff tendinitis, left. 3. Tenosynovitis of left shoulder.  Plan:  1. Treatment options were discussed today with the patient. 2. The patient is scheduled for a left shoulder arthroscopy with debridement, decompression, potential labral repair and probable biceps tenodesis. 3. Surgery scheduled with Dr. Joice Lofts on 11/27/2021. The patient was instructed on the risk and benefits of surgery and wishes to proceed at this time. 4. This document will serve as a surgical history and physical for the patient. 5. The patient will follow-up per standard postop protocol. They can call the clinic they have any questions, new symptoms develop or symptoms worsen.  The procedure was discussed with the patient, as were the potential risks (including bleeding, infection, nerve and/or blood vessel injury, persistent or recurrent pain, failure of the repair, progression of arthritis, need for further surgery, blood clots, strokes, heart attacks and/or arhythmias, pneumonia, etc.) and benefits. The patient states his understanding and wishes to proceed.   H&P reviewed and patient re-examined. No changes.

## 2021-11-27 NOTE — Anesthesia Preprocedure Evaluation (Addendum)
Anesthesia Evaluation  Patient identified by MRN, date of birth, ID band Patient awake    Reviewed: Allergy & Precautions, NPO status , Patient's Chart, lab work & pertinent test results  History of Anesthesia Complications Negative for: history of anesthetic complications  Airway Mallampati: III  TM Distance: <3 FB Neck ROM: full    Dental  (+) Chipped, Poor Dentition   Pulmonary neg shortness of breath, sleep apnea ,    Pulmonary exam normal        Cardiovascular Exercise Tolerance: Good hypertension, (-) anginaNormal cardiovascular exam     Neuro/Psych PSYCHIATRIC DISORDERS negative neurological ROS     GI/Hepatic negative GI ROS, Neg liver ROS, neg GERD  ,  Endo/Other  negative endocrine ROS  Renal/GU Renal disease     Musculoskeletal   Abdominal   Peds  Hematology negative hematology ROS (+)   Anesthesia Other Findings Past Medical History: No date: Allergy No date: Chicken pox No date: Chronic kidney disease No date: Depression No date: Difficult intubation     Comment:  airway is small, he request to have a youth size tube No date: Hematuria No date: History of kidney stones No date: HLD (hyperlipidemia) No date: Hypertension No date: Kidney stone No date: Rosacea No date: Sleep apnea  Past Surgical History: No date: BACK SURGERY No date: COLONOSCOPY 03/01/2015: CYSTOSCOPY W/ RETROGRADES; Left     Comment:  Procedure: CYSTOSCOPY WITH RETROGRADE PYELOGRAM;                Surgeon: Vanna Scotland, MD;  Location: [facility];                Service: Urology;  Laterality: Left; 03/08/2015: CYSTOSCOPY WITH STENT PLACEMENT; Right     Comment:  Procedure: CYSTOSCOPY WITH   STENT PLACEMENT;  Surgeon:               Vanna Scotland, MD;  Location: [facility];  Service:               Urology;  Laterality: Right; 03/01/2015: CYSTOSCOPY/URETEROSCOPY/HOLMIUM LASER/STENT PLACEMENT;  Right     Comment:   Procedure: CYSTOSCOPY/URETEROSCOPY;  Surgeon: Vanna Scotland, MD;  Location: [facility];  Service: Urology;                Laterality: Right; No date: LITHOTRIPSY; N/A No date: NASAL SINUS SURGERY     Comment:  x2 03/08/2015: URETEROSCOPY WITH HOLMIUM LASER LITHOTRIPSY; Right     Comment:  Procedure: URETEROSCOPY WITH HOLMIUM LASER LITHOTRIPSY               AND RETROGRADE ZOXWRUEA;  Surgeon: Vanna Scotland, MD;                Location: [facility];  Service: Urology;  Laterality:               Right; No date: WISDOM TOOTH EXTRACTION  BMI    Body Mass Index: 24.14 kg/m      Reproductive/Obstetrics negative OB ROS                             Anesthesia Physical Anesthesia Plan  ASA: 3  Anesthesia Plan: General ETT   Post-op Pain Management: Regional block*   Induction: Intravenous  PONV Risk Score and Plan: Ondansetron, Dexamethasone, Midazolam and Treatment may vary due to age or medical condition  Airway  Management Planned: Oral ETT and Video Laryngoscope Planned  Additional Equipment:   Intra-op Plan:   Post-operative Plan: Extubation in OR  Informed Consent: I have reviewed the patients History and Physical, chart, labs and discussed the procedure including the risks, benefits and alternatives for the proposed anesthesia with the patient or authorized representative who has indicated his/her understanding and acceptance.     Dental Advisory Given  Plan Discussed with: Anesthesiologist, CRNA and Surgeon  Anesthesia Plan Comments: (Patient consented for risks of anesthesia including but not limited to:  - adverse reactions to medications - damage to eyes, teeth, lips or other oral mucosa - nerve damage due to positioning  - sore throat or hoarseness - Damage to heart, brain, nerves, lungs, other parts of body or loss of life  Patient voiced understanding.)       Anesthesia Quick Evaluation

## 2021-11-27 NOTE — Discharge Instructions (Addendum)
Orthopedic discharge instructions: Keep dressing dry and intact.  May shower after nerve block has worn off (Friday or Saturday).  Cover staples/sutures with Band-Aids after drying off. Apply ice frequently to shoulder. Take ibuprofen 600-800 mg TID with meals for 5-7 days, then as necessary. Take oxycodone as prescribed when needed.  May supplement with ES Tylenol if necessary. Keep shoulder immobilizer on at all times except may remove for bathing purposes. Follow-up in 10-14 days or as scheduled.  AMBULATORY SURGERY  DISCHARGE INSTRUCTIONS   The drugs that you were given will stay in your system until tomorrow so for the next 24 hours you should not:  Drive an automobile Make any legal decisions Drink any alcoholic beverage   You may resume regular meals tomorrow.  Today it is better to start with liquids and gradually work up to solid foods.  You may eat anything you prefer, but it is better to start with liquids, then soup and crackers, and gradually work up to solid foods.   Please notify your doctor immediately if you have any unusual bleeding, trouble breathing, redness and pain at the surgery site, drainage, fever, or pain not relieved by medication.    Additional Instructions:   Please contact your physician with any problems or Same Day Surgery at [phone], Monday through Friday 6 am to 4 pm, or [place] [facility] number at [phone].    SHOULDER SLING IMMOBILIZER   VIDEO Slingshot 2 Shoulder Brace Application - YouTube ---[email or web address]  INSTRUCTIONS While supporting the injured arm, slide the forearm into the sling. Wrap the adjustable shoulder strap around the neck and shoulders and attach the strap end to the sling using  the "alligator strap tab."  Adjust the shoulder strap to the required length. Position the shoulder pad behind the neck. To secure the shoulder pad location (optional), pull the shoulder strap  away from the shoulder pad, unfold the hook material on the top of the pad, then press the shoulder strap back onto the hook material to secure the pad in place. Attach the closure strap across the open top of the sling. Position the strap so that it holds the arm securely in the sling. Next, attach the thumb strap to the open end of the sling between the thumb and fingers. After sling has been fit, it may be easily removed and reapplied using the quick release buckle on shoulder strap. If a neutral pillow or 15 abduction pillow is included, place the pillow at the waistline. Attach the sling to the pillow, lining up hook material on the pillow with the loop on sling. Adjust the waist strap to fit.  If waist strap is too long, cut it to fit. Use the small piece of double sided hook material (located on top of the pillow) to secure the strap end. Place the double sided hook material on the inside of the cut strap end and secure it to the waist strap.     If no pillow is included, attach the waist strap to the sling and adjust to fit.    Washing Instructions: Straps and sling must be removed and cleaned regularly depending on your activity level and perspiration. Hand wash straps and sling in cold water with mild detergent, rinse, air dry

## 2021-11-27 NOTE — Transfer of Care (Signed)
Immediate Anesthesia Transfer of Care Note  Patient: Gregory Guzman  Procedure(s) Performed: Left shoulder arthroscopy with debridement, decompression, labral repair, and biceps tenodesis (Left: Shoulder)  Patient Location: PACU  Anesthesia Type:General  Level of Consciousness: drowsy and patient cooperative  Airway & Oxygen Therapy: Patient Spontanous Breathing and Patient connected to face mask oxygen  Post-op Assessment: Report given to RN and Post -op Vital signs reviewed and stable  Post vital signs: Reviewed and stable  Last Vitals:  Vitals Value Taken Time  BP 134/72 11/27/21 1602  Temp    Pulse 91 11/27/21 1607  Resp 21 11/27/21 1607  SpO2 97 % 11/27/21 1607  Vitals shown include unvalidated device data.  Last Pain:  Vitals:   11/27/21 1142  TempSrc: Temporal  PainSc: 3          Complications: No notable events documented.

## 2021-11-27 NOTE — Anesthesia Procedure Notes (Signed)
Anesthesia Regional Block: Interscalene brachial plexus block   Pre-Anesthetic Checklist: , timeout performed,  Correct Patient, Correct Site, Correct Laterality,  Correct Procedure, Correct Position, site marked,  Risks and benefits discussed,  Surgical consent,  Pre-op evaluation,  At surgeon's request and post-op pain management  Laterality: Upper and Left  Prep: chloraprep       Needles:  Injection technique: Single-shot  Needle Type: Stimiplex     Needle Length: 9cm  Needle Gauge: 22     Additional Needles:   Procedures:,,,, ultrasound used (permanent image in chart),,    Narrative:  Start time: 11/27/2021 12:28 PM End time: 11/27/2021 12:30 PM Injection made incrementally with aspirations every 5 mL.  Performed by: Personally  Anesthesiologist: Kylen Ismael, Cleda Mccreedy, MD  Additional Notes: Patient consented for risk and benefits of nerve block including but not limited to nerve damage, failed block, bleeding and infection.  Patient voiced understanding.  Functioning IV was confirmed and monitors were applied.  Timeout done prior to procedure and prior to any sedation being given to the patient.  Patient confirmed procedure site prior to any sedation given to the patient.  A 90mm 22ga Stimuplex needle was used. Sterile prep,hand hygiene and sterile gloves were used.  Minimal sedation used for procedure.  No paresthesia endorsed by patient during the procedure.  Negative aspiration and negative test dose prior to incremental administration of local anesthetic. The patient tolerated the procedure well with no immediate complications.

## 2021-11-28 ENCOUNTER — Encounter: Payer: Self-pay | Admitting: Surgery

## 2021-11-28 NOTE — Anesthesia Postprocedure Evaluation (Signed)
Anesthesia Post Note  Patient: Gregory Guzman  Procedure(s) Performed: Left shoulder arthroscopy with debridement, decompression, labral repair, and biceps tenodesis (Left: Shoulder)  Patient location during evaluation: PACU Anesthesia Type: General Level of consciousness: awake and alert Pain management: pain level controlled Vital Signs Assessment: post-procedure vital signs reviewed and stable Respiratory status: spontaneous breathing, nonlabored ventilation and respiratory function stable Cardiovascular status: blood pressure returned to baseline and stable Postop Assessment: no apparent nausea or vomiting Anesthetic complications: no   No notable events documented.   Last Vitals:  Vitals:   11/27/21 1645 11/27/21 1715  BP: (!) 148/104 (!) 138/98  Pulse: 79 70  Resp: 16 16  Temp: (!) 36.2 C   SpO2: 100% 100%    Last Pain:  Vitals:   11/27/21 1715  TempSrc:   PainSc: 2                  Foye Deer

## 2021-12-18 ENCOUNTER — Ambulatory Visit
Admission: RE | Admit: 2021-12-18 | Discharge: 2021-12-18 | Disposition: A | Discharge status: Home / Self Care | Payer: 59 | Source: Ambulatory Visit | Attending: Student | Admitting: Student

## 2021-12-18 ENCOUNTER — Other Ambulatory Visit: Payer: Self-pay | Admitting: Student

## 2021-12-18 DIAGNOSIS — M7989 Other specified soft tissue disorders: Secondary | ICD-10-CM

## 2023-04-15 ENCOUNTER — Ambulatory Visit
Admission: RE | Admit: 2023-04-15 | Discharge: 2023-04-15 | Disposition: A | Discharge status: Home / Self Care | Payer: Managed Care, Other (non HMO) | Source: Ambulatory Visit | Attending: Emergency Medicine | Admitting: Emergency Medicine

## 2023-04-15 VITALS — BP 139/83 | HR 97 | Temp 98.1°F | Resp 18

## 2023-04-15 DIAGNOSIS — J069 Acute upper respiratory infection, unspecified: Secondary | ICD-10-CM | POA: Diagnosis not present

## 2023-04-15 MED ORDER — AZITHROMYCIN 250 MG PO TABS
250.0000 mg | ORAL_TABLET | Freq: Every day | ORAL | 0 refills | Status: AC
Start: 2023-04-15 — End: ?

## 2023-04-15 NOTE — ED Provider Notes (Signed)
Gregory Guzman    CSN: [id number] Arrival date & time: 04/15/23  9563      History   Chief Complaint Chief Complaint  Patient presents with   Cough    Flu like symptoms for six days.CoughChillsAchesHead/chest congestionWeaknessLow fever - Entered by patient    HPI Gregory Guzman is a 53 y.o. male.  Patient presents with 6-day history of congestion and cough.  He had fever and chills but these have resolved.  He reports ongoing fatigue.  His cough had been improving but got worse again today.  He has been treating his symptoms with decongestant and ibuprofen.  He denies chest pain, shortness of breath, vomiting, diarrhea.  The history is provided by the patient and medical records.    Past Medical History:  Diagnosis Date   Allergy    Chicken pox    Chronic kidney disease    Depression    Difficult intubation    airway is small, he request to have a youth size tube   Hematuria    History of kidney stones    HLD (hyperlipidemia)    Hypertension    Kidney stone    Rosacea    Sleep apnea     Patient Active Problem List   Diagnosis Date Noted   HLD (hyperlipidemia) 06/01/2014   Clinical depression 12/01/2013   Apnea, sleep 12/01/2013    Past Surgical History:  Procedure Laterality Date   BACK SURGERY     COLONOSCOPY     CYSTOSCOPY W/ RETROGRADES Left 03/01/2015   Procedure: CYSTOSCOPY WITH RETROGRADE PYELOGRAM;  Surgeon: Vanna Scotland, MD;  Location: [facility];  Service: Urology;  Laterality: Left;   CYSTOSCOPY WITH STENT PLACEMENT Right 03/08/2015   Procedure: CYSTOSCOPY WITH   STENT PLACEMENT;  Surgeon: Vanna Scotland, MD;  Location: [facility];  Service: Urology;  Laterality: Right;   CYSTOSCOPY/URETEROSCOPY/HOLMIUM LASER/STENT PLACEMENT Right 03/01/2015   Procedure: CYSTOSCOPY/URETEROSCOPY;  Surgeon: Vanna Scotland, MD;  Location: [facility];  Service: Urology;  Laterality: Right;   LITHOTRIPSY N/A    NASAL SINUS SURGERY     x2   SHOULDER ARTHROSCOPY WITH  DEBRIDEMENT AND BICEP TENDON REPAIR Left 11/27/2021   Procedure: Left shoulder arthroscopy with debridement, decompression, labral repair, and biceps tenodesis;  Surgeon: Christena Flake, MD;  Location: [facility];  Service: Orthopedics;  Laterality: Left;   URETEROSCOPY WITH HOLMIUM LASER LITHOTRIPSY Right 03/08/2015   Procedure: URETEROSCOPY WITH HOLMIUM LASER LITHOTRIPSY AND RETROGRADE OVFIEPPI;  Surgeon: Vanna Scotland, MD;  Location: [facility];  Service: Urology;  Laterality: Right;   WISDOM TOOTH EXTRACTION         Home Medications    Prior to Admission medications   Medication Sig Start Date End Date Taking? Authorizing Provider  azithromycin (ZITHROMAX) 250 MG tablet Take 1 tablet (250 mg total) by mouth daily. Take first 2 tablets together, then 1 every day until finished. 04/15/23  Yes Mickie Bail, NP  cetirizine (ZYRTEC) 10 MG tablet Take 10 mg by mouth as needed.    [provider]  citalopram (CELEXA) 20 MG tablet Take 30 mg by mouth every morning. 01/02/15   [provider]  losartan-hydrochlorothiazide (HYZAAR) 50-12.5 MG tablet Take 1 tablet by mouth daily. 07/10/20   [provider]  oxyCODONE (ROXICODONE) 5 MG immediate release tablet Take 1-2 tablets (5-10 mg total) by mouth every 4 (four) hours as needed for moderate pain or severe pain. 11/27/21   Poggi, Excell Seltzer, MD  rosuvastatin (CRESTOR) 40 MG tablet  Take 20 mg by mouth daily.    [provider]    Family History Family History  Problem Relation Age of Onset   Kidney Stones Mother    Heart disease Father    Kidney Stones Maternal Grandmother    Heart disease Paternal Grandmother    Thyroid disease Sister     Social History Social History   Tobacco Use   Smoking status: Never   Smokeless tobacco: Never  Vaping Use   Vaping status: Never Used  Substance Use Topics   Alcohol use: Yes    Alcohol/week: 15.0 standard drinks of alcohol    Types: 15 Standard drinks or equivalent per  week    Comment: 15 drinks per week   Drug use: No     Allergies   Other and Pravastatin   Review of Systems Review of Systems  Constitutional:  Positive for fatigue. Negative for chills and fever.  HENT:  Positive for congestion. Negative for ear pain and sore throat.   Respiratory:  Positive for cough. Negative for shortness of breath.   Cardiovascular:  Negative for chest pain and palpitations.  Gastrointestinal:  Negative for diarrhea and vomiting.     Physical Exam Triage Vital Signs ED Triage Vitals [04/15/23 1845]  Encounter Vitals Group     BP 139/83     Systolic BP Percentile      Diastolic BP Percentile      Pulse Rate 97     Resp 18     Temp 98.1 F (36.7 C)     Temp src      SpO2 98 %     Weight      Height      Head Circumference      Peak Flow      Pain Score 3     Pain Loc      Pain Education      Exclude from Growth Chart    No data found.  Updated Vital Signs BP 139/83   Pulse 97   Temp 98.1 F (36.7 C)   Resp 18   SpO2 98%   Visual Acuity Right Eye Distance:   Left Eye Distance:   Bilateral Distance:    Right Eye Near:   Left Eye Near:    Bilateral Near:     Physical Exam Constitutional:      General: He is not in acute distress. HENT:     Right Ear: Tympanic membrane normal.     Left Ear: Tympanic membrane normal.     Nose: Nose normal.     Mouth/Throat:     Mouth: Mucous membranes are moist.     Pharynx: Oropharynx is clear.  Cardiovascular:     Rate and Rhythm: Normal rate and regular rhythm.     Heart sounds: Normal heart sounds.  Pulmonary:     Effort: Pulmonary effort is normal. No respiratory distress.     Breath sounds: Normal breath sounds.  Neurological:     Mental Status: He is alert.      UC Treatments / Results  Labs (all labs ordered are listed, but only abnormal results are displayed) Labs Reviewed - No data to display  EKG   Radiology No results found.  Procedures Procedures (including  critical care time)  Medications Ordered in UC Medications - No data to display  Initial Impression / Assessment and Plan / UC Course  I have reviewed the triage vital signs and the nursing notes.  Pertinent  labs & imaging results that were available during my care of the patient were reviewed by me and considered in my medical decision making (see chart for details).    Acute upper respiratory infection.  Afebrile and vital signs are stable.  Lungs are clear and O2 sat is 98% on room air.  Patient has been symptomatic for 6 days.  Treating today with Zithromax.  Tylenol as needed.  Plain Mucinex as needed.  Instructed him to follow-up with his PCP if he is not improving.  He agrees to plan of care.  Final Clinical Impressions(s) / UC Diagnoses   Final diagnoses:  Acute upper respiratory infection     Discharge Instructions      Take the Zithromax as directed.  Follow-up with your primary care provider if your symptoms are not improving.      ED Prescriptions     Medication Sig Dispense Auth. Provider   azithromycin (ZITHROMAX) 250 MG tablet Take 1 tablet (250 mg total) by mouth daily. Take first 2 tablets together, then 1 every day until finished. 6 tablet Mickie Bail, NP      PDMP not reviewed this encounter.   Mickie Bail, NP 04/15/23 [phone]

## 2023-04-15 NOTE — Discharge Instructions (Addendum)
Take the Zithromax as directed.  Follow up with your primary care provider if your symptoms are not improving.    

## 2023-04-15 NOTE — ED Triage Notes (Signed)
Patient to Urgent Care with complaints of cough/ chills/ body aches/ nasal congestion/ weakness/ fever/ fatigue.  Reports symptoms started six days ago. Woke up this morning with worsening cough.  Meds: decongestant/ Nyquil/ ibuprofen.
